# Patient Record
Sex: Female | Born: 1940 | Race: White | Hispanic: No | Marital: Married | Smoking: Never smoker
Health system: Southern US, Community
[De-identification: ages and names within clinical notes are randomized; demographics above are authoritative.]

## PROBLEM LIST (undated history)

## (undated) DIAGNOSIS — K5790 Diverticulosis of intestine, part unspecified, without perforation or abscess without bleeding: Secondary | ICD-10-CM

## (undated) DIAGNOSIS — E785 Hyperlipidemia, unspecified: Secondary | ICD-10-CM

## (undated) DIAGNOSIS — K209 Esophagitis, unspecified without bleeding: Secondary | ICD-10-CM

## (undated) DIAGNOSIS — K219 Gastro-esophageal reflux disease without esophagitis: Secondary | ICD-10-CM

## (undated) DIAGNOSIS — E039 Hypothyroidism, unspecified: Secondary | ICD-10-CM

## (undated) DIAGNOSIS — Z860100 Personal history of colon polyps, unspecified: Secondary | ICD-10-CM

## (undated) DIAGNOSIS — N390 Urinary tract infection, site not specified: Secondary | ICD-10-CM

## (undated) DIAGNOSIS — K5909 Other constipation: Secondary | ICD-10-CM

## (undated) DIAGNOSIS — I10 Essential (primary) hypertension: Secondary | ICD-10-CM

## (undated) DIAGNOSIS — I639 Cerebral infarction, unspecified: Secondary | ICD-10-CM

## (undated) HISTORY — PX: ESOPHAGOGASTRODUODENOSCOPY: SHX1529

## (undated) HISTORY — PX: COLONOSCOPY: SHX174

## (undated) HISTORY — PX: ABDOMINAL HYSTERECTOMY: SHX81

## (undated) HISTORY — PX: CERVICAL LAMINECTOMY: SHX94

## (undated) HISTORY — PX: BACK SURGERY: SHX140

---

## 2004-05-08 ENCOUNTER — Ambulatory Visit: Payer: Self-pay | Admitting: Internal Medicine

## 2004-05-19 ENCOUNTER — Ambulatory Visit: Payer: Self-pay | Admitting: Internal Medicine

## 2004-05-19 ENCOUNTER — Inpatient Hospital Stay: Payer: Self-pay | Admitting: Surgery

## 2005-05-27 ENCOUNTER — Ambulatory Visit: Payer: Self-pay

## 2005-06-07 ENCOUNTER — Ambulatory Visit: Payer: Self-pay

## 2005-10-14 ENCOUNTER — Ambulatory Visit: Payer: Self-pay | Admitting: Gastroenterology

## 2006-05-30 ENCOUNTER — Ambulatory Visit: Payer: Self-pay | Admitting: Internal Medicine

## 2006-09-13 ENCOUNTER — Ambulatory Visit: Payer: Self-pay

## 2007-07-11 ENCOUNTER — Other Ambulatory Visit: Payer: Self-pay

## 2007-07-11 ENCOUNTER — Observation Stay: Payer: Self-pay | Admitting: Internal Medicine

## 2009-02-28 ENCOUNTER — Emergency Department: Payer: Self-pay | Admitting: Emergency Medicine

## 2009-03-17 ENCOUNTER — Ambulatory Visit: Payer: Self-pay | Admitting: Internal Medicine

## 2009-07-31 ENCOUNTER — Ambulatory Visit: Payer: Self-pay | Admitting: Specialist

## 2009-10-31 ENCOUNTER — Ambulatory Visit: Payer: Self-pay | Admitting: Internal Medicine

## 2010-09-10 ENCOUNTER — Ambulatory Visit: Payer: Self-pay | Admitting: Internal Medicine

## 2010-11-18 ENCOUNTER — Ambulatory Visit: Payer: Self-pay | Admitting: Internal Medicine

## 2010-12-09 ENCOUNTER — Ambulatory Visit: Payer: Self-pay | Admitting: Gastroenterology

## 2010-12-14 LAB — PATHOLOGY REPORT

## 2011-11-25 ENCOUNTER — Ambulatory Visit: Payer: Self-pay | Admitting: Internal Medicine

## 2012-11-27 ENCOUNTER — Ambulatory Visit: Payer: Self-pay | Admitting: Internal Medicine

## 2013-08-27 DIAGNOSIS — E785 Hyperlipidemia, unspecified: Secondary | ICD-10-CM | POA: Insufficient documentation

## 2013-08-27 DIAGNOSIS — K219 Gastro-esophageal reflux disease without esophagitis: Secondary | ICD-10-CM | POA: Insufficient documentation

## 2013-08-27 DIAGNOSIS — N951 Menopausal and female climacteric states: Secondary | ICD-10-CM | POA: Insufficient documentation

## 2013-08-27 DIAGNOSIS — E039 Hypothyroidism, unspecified: Secondary | ICD-10-CM | POA: Insufficient documentation

## 2013-10-17 ENCOUNTER — Other Ambulatory Visit: Payer: Self-pay | Admitting: Vascular Surgery

## 2013-10-17 LAB — LIPID PANEL
CHOLESTEROL: 200 mg/dL (ref 0–200)
HDL Cholesterol: 76 mg/dL — ABNORMAL HIGH (ref 40–60)
Ldl Cholesterol, Calc: 99 mg/dL (ref 0–100)
Triglycerides: 123 mg/dL (ref 0–200)
VLDL CHOLESTEROL, CALC: 25 mg/dL (ref 5–40)

## 2014-04-11 ENCOUNTER — Ambulatory Visit: Payer: Self-pay | Admitting: Gastroenterology

## 2014-05-07 ENCOUNTER — Ambulatory Visit: Payer: Self-pay | Admitting: Ophthalmology

## 2014-06-17 LAB — SURGICAL PATHOLOGY

## 2016-04-13 ENCOUNTER — Other Ambulatory Visit: Payer: Self-pay | Admitting: Family Medicine

## 2016-04-13 DIAGNOSIS — Z1231 Encounter for screening mammogram for malignant neoplasm of breast: Secondary | ICD-10-CM

## 2016-05-12 ENCOUNTER — Ambulatory Visit
Admission: RE | Admit: 2016-05-12 | Discharge: 2016-05-12 | Disposition: A | Discharge status: Home / Self Care | Payer: Medicare Other | Source: Ambulatory Visit | Attending: Family Medicine | Admitting: Family Medicine

## 2016-05-12 DIAGNOSIS — Z1231 Encounter for screening mammogram for malignant neoplasm of breast: Secondary | ICD-10-CM | POA: Insufficient documentation

## 2017-02-25 ENCOUNTER — Encounter: Payer: Self-pay | Admitting: *Deleted

## 2017-02-28 ENCOUNTER — Ambulatory Visit
Admission: RE | Admit: 2017-02-28 | Discharge: 2017-02-28 | Disposition: A | Discharge status: Home / Self Care | Payer: Medicare Other | Source: Ambulatory Visit | Attending: Gastroenterology | Admitting: Gastroenterology

## 2017-02-28 ENCOUNTER — Ambulatory Visit: Payer: Medicare Other | Admitting: Anesthesiology

## 2017-02-28 ENCOUNTER — Encounter: Payer: Self-pay | Admitting: Anesthesiology

## 2017-02-28 ENCOUNTER — Encounter
Admission: RE | Disposition: A | Discharge status: Home / Self Care | Payer: Self-pay | Source: Ambulatory Visit | Attending: Gastroenterology

## 2017-02-28 DIAGNOSIS — K219 Gastro-esophageal reflux disease without esophagitis: Secondary | ICD-10-CM | POA: Insufficient documentation

## 2017-02-28 DIAGNOSIS — Z1211 Encounter for screening for malignant neoplasm of colon: Secondary | ICD-10-CM | POA: Diagnosis not present

## 2017-02-28 DIAGNOSIS — D120 Benign neoplasm of cecum: Secondary | ICD-10-CM | POA: Insufficient documentation

## 2017-02-28 DIAGNOSIS — Z86010 Personal history of colonic polyps: Secondary | ICD-10-CM | POA: Insufficient documentation

## 2017-02-28 DIAGNOSIS — I10 Essential (primary) hypertension: Secondary | ICD-10-CM | POA: Insufficient documentation

## 2017-02-28 DIAGNOSIS — Z8719 Personal history of other diseases of the digestive system: Secondary | ICD-10-CM | POA: Insufficient documentation

## 2017-02-28 DIAGNOSIS — K644 Residual hemorrhoidal skin tags: Secondary | ICD-10-CM | POA: Insufficient documentation

## 2017-02-28 DIAGNOSIS — Z8673 Personal history of transient ischemic attack (TIA), and cerebral infarction without residual deficits: Secondary | ICD-10-CM | POA: Diagnosis not present

## 2017-02-28 DIAGNOSIS — Z7902 Long term (current) use of antithrombotics/antiplatelets: Secondary | ICD-10-CM | POA: Diagnosis not present

## 2017-02-28 DIAGNOSIS — D122 Benign neoplasm of ascending colon: Secondary | ICD-10-CM | POA: Insufficient documentation

## 2017-02-28 DIAGNOSIS — E039 Hypothyroidism, unspecified: Secondary | ICD-10-CM | POA: Insufficient documentation

## 2017-02-28 DIAGNOSIS — Z79899 Other long term (current) drug therapy: Secondary | ICD-10-CM | POA: Insufficient documentation

## 2017-02-28 DIAGNOSIS — K641 Second degree hemorrhoids: Secondary | ICD-10-CM | POA: Diagnosis not present

## 2017-02-28 DIAGNOSIS — K6389 Other specified diseases of intestine: Secondary | ICD-10-CM | POA: Insufficient documentation

## 2017-02-28 DIAGNOSIS — E785 Hyperlipidemia, unspecified: Secondary | ICD-10-CM | POA: Diagnosis not present

## 2017-02-28 DIAGNOSIS — K5909 Other constipation: Secondary | ICD-10-CM | POA: Diagnosis not present

## 2017-02-28 DIAGNOSIS — Q438 Other specified congenital malformations of intestine: Secondary | ICD-10-CM | POA: Insufficient documentation

## 2017-02-28 DIAGNOSIS — D123 Benign neoplasm of transverse colon: Secondary | ICD-10-CM | POA: Diagnosis not present

## 2017-02-28 DIAGNOSIS — D124 Benign neoplasm of descending colon: Secondary | ICD-10-CM | POA: Insufficient documentation

## 2017-02-28 HISTORY — DX: Essential (primary) hypertension: I10

## 2017-02-28 HISTORY — PX: COLONOSCOPY WITH PROPOFOL: SHX5780

## 2017-02-28 HISTORY — DX: Esophagitis, unspecified without bleeding: K20.90

## 2017-02-28 HISTORY — DX: Gastro-esophageal reflux disease without esophagitis: K21.9

## 2017-02-28 HISTORY — DX: Diverticulosis of intestine, part unspecified, without perforation or abscess without bleeding: K57.90

## 2017-02-28 HISTORY — DX: Personal history of colon polyps, unspecified: Z86.0100

## 2017-02-28 HISTORY — DX: Urinary tract infection, site not specified: N39.0

## 2017-02-28 HISTORY — DX: Hyperlipidemia, unspecified: E78.5

## 2017-02-28 HISTORY — DX: Cerebral infarction, unspecified: I63.9

## 2017-02-28 HISTORY — DX: Esophagitis, unspecified: K20.9

## 2017-02-28 HISTORY — DX: Hypothyroidism, unspecified: E03.9

## 2017-02-28 HISTORY — DX: Other constipation: K59.09

## 2017-02-28 HISTORY — DX: Personal history of colonic polyps: Z86.010

## 2017-02-28 SURGERY — COLONOSCOPY WITH PROPOFOL
Anesthesia: General

## 2017-02-28 MED ORDER — LIDOCAINE HCL (PF) 2 % IJ SOLN
INTRAMUSCULAR | Status: AC
  Filled 2017-02-28: qty 10

## 2017-02-28 MED ORDER — PROPOFOL 500 MG/50ML IV EMUL
INTRAVENOUS | Status: AC
  Filled 2017-02-28: qty 50

## 2017-02-28 MED ORDER — SODIUM CHLORIDE 0.9 % IV SOLN
INTRAVENOUS | Status: DC
  Administered 2017-02-28: 1000 mL via INTRAVENOUS
  Administered 2017-02-28: 10:00:00 via INTRAVENOUS

## 2017-02-28 MED ORDER — PHENYLEPHRINE HCL 10 MG/ML IJ SOLN
INTRAMUSCULAR | Status: DC | PRN
  Administered 2017-02-28 (×3): 100 ug via INTRAVENOUS

## 2017-02-28 MED ORDER — FENTANYL CITRATE (PF) 100 MCG/2ML IJ SOLN
INTRAMUSCULAR | Status: AC
  Filled 2017-02-28: qty 2

## 2017-02-28 MED ORDER — PROPOFOL 500 MG/50ML IV EMUL
INTRAVENOUS | Status: DC | PRN
  Administered 2017-02-28: 160 ug/kg/min via INTRAVENOUS

## 2017-02-28 MED ORDER — SODIUM CHLORIDE 0.9 % IV SOLN: INTRAVENOUS | Status: DC

## 2017-02-28 MED ORDER — PROPOFOL 10 MG/ML IV BOLUS
INTRAVENOUS | Status: AC
  Filled 2017-02-28: qty 20

## 2017-02-28 MED ORDER — PROPOFOL 10 MG/ML IV BOLUS
INTRAVENOUS | Status: DC | PRN
  Administered 2017-02-28: 30 mg via INTRAVENOUS

## 2017-02-28 MED ORDER — MIDAZOLAM HCL 2 MG/2ML IJ SOLN
INTRAMUSCULAR | Status: DC | PRN
  Administered 2017-02-28: 1 mg via INTRAVENOUS

## 2017-02-28 MED ORDER — MIDAZOLAM HCL 2 MG/2ML IJ SOLN
INTRAMUSCULAR | Status: AC
  Filled 2017-02-28: qty 2

## 2017-02-28 MED ORDER — FENTANYL CITRATE (PF) 100 MCG/2ML IJ SOLN
INTRAMUSCULAR | Status: DC | PRN
  Administered 2017-02-28: 50 ug via INTRAVENOUS

## 2017-02-28 NOTE — Anesthesia Procedure Notes (Signed)
Date/Time: 02/28/2017 9:50 AM Performed by: Allean Found, CRNA Pre-anesthesia Checklist: Patient identified, Emergency Drugs available, Suction available, Patient being monitored and Timeout performed Patient Re-evaluated:Patient Re-evaluated prior to induction Oxygen Delivery Method: Nasal cannula Placement Confirmation: positive ETCO2

## 2017-02-28 NOTE — H&P (Signed)
Outpatient short stay form Pre-procedure 02/28/2017 9:39 AM Lollie Sails MD  Primary Physician: Dr Derinda Late   Reason for visit:  Colonoscopy  History of present illness:  Patient is a 77 year old female presenting today as above. She has personal history of adenomatous colon polyps. She does take Plavix and states her last time she took it was on Wednesday morning. She takes no other blood thinning medications. She tolerated her prep well.    Current Facility-Administered Medications:  .  0.9 %  sodium chloride infusion, , Intravenous, Continuous, Lollie Sails, MD, Last Rate: 20 mL/hr at 02/28/17 0859, 1,000 mL at 02/28/17 0859 .  0.9 %  sodium chloride infusion, , Intravenous, Continuous, Lollie Sails, MD  Medications Prior to Admission  Medication Sig Dispense Refill Last Dose  . b complex vitamins tablet Take 1 tablet by mouth daily.   Past Week at Unknown time  . clopidogrel (PLAVIX) 75 MG tablet Take 75 mg by mouth daily.   Past Week at Unknown time  . co-enzyme Q-10 30 MG capsule Take 10 mg by mouth daily.   Past Week at Unknown time  . estrogens, conjugated, (PREMARIN) 0.45 MG tablet Take 0.45 mg by mouth daily. Take daily for 21 days then do not take for 7 days.   Past Week at Unknown time  . levothyroxine (SYNTHROID, LEVOTHROID) 75 MCG tablet Take 75 mcg by mouth daily before breakfast.   02/27/2017 at Unknown time  . omega-3 acid ethyl esters (LOVAZA) 1 g capsule Take 1 g by mouth.   Past Week at Unknown time  . omeprazole (PRILOSEC) 20 MG capsule Take 20 mg by mouth daily.   Past Week at Unknown time  . Red Yeast Rice 600 MG CAPS Take 1,200 mg by mouth Nightly.   Past Week at Unknown time  . Turmeric 500 MG TABS Take 150 mg by mouth daily.   Past Week at Unknown time  . vitamin E 400 UNIT capsule Take 400 Units by mouth daily.   Past Week at Unknown time     Not on File   Past Medical History:  Diagnosis Date  . Chronic constipation   .  Diverticulosis   . Esophagitis   . GERD (gastroesophageal reflux disease)   . History of colon polyps   . Hyperlipidemia   . Hypertension   . Hypothyroidism   . Stroke ([place])     TIA-2011  . UTI (urinary tract infection)     Review of systems:      Physical Exam    Heart and lungs: Regular rate and rhythm without rub or gallop, lungs are bilaterally clear.    HEENT: Normocephalic atraumatic eyes are anicteric    Other:     Pertinant exam for procedure: Soft nontender nondistended bowel sounds positive normoactive    Planned proceedures: Colonoscopy and indicated procedures. I have discussed the risks benefits and complications of procedures to include not limited to bleeding, infection, perforation and the risk of sedation and the patient wishes to proceed.    Lollie Sails, MD Gastroenterology 02/28/2017  9:39 AM

## 2017-02-28 NOTE — Transfer of Care (Signed)
Immediate Anesthesia Transfer of Care Note  Patient: Cassandra Johns  Procedure(s) Performed: COLONOSCOPY WITH PROPOFOL (N/A )  Patient Location: PACU  Anesthesia Type:General  Level of Consciousness: awake  Airway & Oxygen Therapy: Patient Spontanous Breathing and Patient connected to nasal cannula oxygen  Post-op Assessment: Report given to RN and Post -op Vital signs reviewed and stable  Post vital signs: Reviewed and stable  Last Vitals:  Vitals:   02/28/17 0835 02/28/17 1055  BP: (!) 127/54 (!) 90/59  Pulse: 68 76  Resp: 16 16  Temp: 36.4 C (!) 35.8 C  SpO2: 100% 99%    Last Pain:  Vitals:   02/28/17 1055  TempSrc: Tympanic         Complications: No apparent anesthesia complications

## 2017-02-28 NOTE — Anesthesia Preprocedure Evaluation (Signed)
Anesthesia Evaluation  Patient identified by MRN, date of birth, ID band Patient awake    Reviewed: Allergy & Precautions, H&P , NPO status , Patient's Chart, lab work & pertinent test results, reviewed documented beta blocker date and time   History of Anesthesia Complications Negative for: history of anesthetic complications  Airway Mallampati: I  TM Distance: >3 FB Neck ROM: full    Dental  (+) Caps, Dental Advidsory Given   Pulmonary neg pulmonary ROS,           Cardiovascular Exercise Tolerance: Good hypertension, (-) angina(-) CAD, (-) Past MI, (-) Cardiac Stents and (-) CABG (-) dysrhythmias (-) Valvular Problems/Murmurs     Neuro/Psych neg Seizures TIAnegative psych ROS   GI/Hepatic Neg liver ROS, GERD  ,  Endo/Other  neg diabetesHypothyroidism   Renal/GU negative Renal ROS  negative genitourinary   Musculoskeletal   Abdominal   Peds  Hematology negative hematology ROS (+)   Anesthesia Other Findings Past Medical History: No date: Chronic constipation No date: Diverticulosis No date: Esophagitis No date: GERD (gastroesophageal reflux disease) No date: History of colon polyps No date: Hyperlipidemia No date: Hypertension No date: Hypothyroidism No date: Stroke [facility])     Comment:   TIA-2011 No date: UTI (urinary tract infection)   Reproductive/Obstetrics negative OB ROS                             Anesthesia Physical Anesthesia Plan  ASA: II  Anesthesia Plan: General   Post-op Pain Management:    Induction: Intravenous  PONV Risk Score and Plan: 3 and Propofol infusion  Airway Management Planned: Nasal Cannula  Additional Equipment:   Intra-op Plan:   Post-operative Plan:   Informed Consent: I have reviewed the patients History and Physical, chart, labs and discussed the procedure including the risks, benefits and alternatives for the proposed anesthesia with  the patient or authorized representative who has indicated his/her understanding and acceptance.   Dental Advisory Given  Plan Discussed with: Anesthesiologist, CRNA and Surgeon  Anesthesia Plan Comments:         Anesthesia Quick Evaluation

## 2017-02-28 NOTE — Anesthesia Postprocedure Evaluation (Signed)
Anesthesia Post Note  Patient: Cassandra Johns  Procedure(s) Performed: COLONOSCOPY WITH PROPOFOL (N/A )  Patient location during evaluation: Endoscopy Anesthesia Type: General Level of consciousness: awake and alert Pain management: pain level controlled Vital Signs Assessment: post-procedure vital signs reviewed and stable Respiratory status: spontaneous breathing, nonlabored ventilation, respiratory function stable and patient connected to nasal cannula oxygen Cardiovascular status: blood pressure returned to baseline and stable Postop Assessment: no apparent nausea or vomiting Anesthetic complications: no     Last Vitals:  Vitals:   02/28/17 1125 02/28/17 1135  BP: 119/74 110/76  Pulse: 77 71  Resp: 17 17  Temp:    SpO2: 100% 100%    Last Pain:  Vitals:   02/28/17 1055  TempSrc: Tympanic                 Martha Clan

## 2017-02-28 NOTE — Anesthesia Post-op Follow-up Note (Signed)
Anesthesia QCDR form completed.        

## 2017-02-28 NOTE — Op Note (Signed)
[facility] Gastroenterology Patient Name: Cassandra Johns Procedure Date: 02/28/2017 9:29 AM MRN: [id number] Account #: [id number] Date of Birth: Dec 04, 1940 Admit Type: Outpatient Age: 77 Room: [facility] ENDO ROOM 1 Gender: Female Note Status: Finalized Procedure:            Colonoscopy Indications:          Personal history of colonic polyps Providers:            Lollie Sails, MD Referring MD:         Caprice Renshaw MD (Referring MD) Medicines:            Monitored Anesthesia Care Complications:        No immediate complications. Procedure:            Pre-Anesthesia Assessment:                       - ASA Grade Assessment: III - A patient with severe                        systemic disease.                       After obtaining informed consent, the colonoscope was                        passed under direct vision. Throughout the procedure,                        the patient's blood pressure, pulse, and oxygen                        saturations were monitored continuously. The                        Colonoscope was introduced through the anus and                        advanced to the the cecum, identified by appendiceal                        orifice and ileocecal valve. The colonoscopy was                        performed with moderate difficulty due to significant                        looping and a tortuous colon. Successful completion of                        the procedure was aided by changing the patient to a                        supine position and changing the patient to a prone                        position. The patient tolerated the procedure well. The                        quality of the bowel preparation was fair. Findings:      A 3 mm polyp  was found in the transverse colon. The polyp was sessile.       The polyp was removed with a cold biopsy forceps. Resection and       retrieval were complete.      A 5 mm polyp was found in the ascending  colon. The polyp was sessile.      Two sessile polyps were found in the cecum. The polyps were 4 to 6 mm in       size. These polyps were removed with a cold snare. Resection and       retrieval were complete.      A 5 mm polyp was found in the hepatic flexure. The polyp was sessile.       The polyp was removed with a cold snare. Resection and retrieval were       complete.      A 5 mm polyp was found in the mid transverse colon. The polyp was       sessile. The polyp was removed with a cold snare. Resection and       retrieval were complete.      Two sessile polyps were found in the descending colon. The polyps were 2       to 5 mm in size. These polyps were removed with a cold snare. Resection       and retrieval were complete.      A diffuse area of mild melanosis was found in the entire colon.      Multiple medium-mouthed diverticula were found in the sigmoid colon and       descending colon.      The digital rectal exam was normal.      Non-bleeding external and internal hemorrhoids were found during       retroflexion and during anoscopy. The hemorrhoids were small and Grade       II (internal hemorrhoids that prolapse but reduce spontaneously).      No additional abnormalities were found on retroflexion. Impression:           - Preparation of the colon was fair.                       - One 3 mm polyp in the transverse colon, removed with                        a cold biopsy forceps. Resected and retrieved.                       - One 5 mm polyp in the ascending colon.                       - Two 4 to 6 mm polyps in the cecum, removed with a                        cold snare. Resected and retrieved.                       - One 5 mm polyp at the hepatic flexure, removed with a                        cold snare. Resected and retrieved.                       -  One 5 mm polyp in the mid transverse colon, removed                        with a cold snare. Resected and retrieved.                        - Two 2 to 5 mm polyps in the descending colon, removed                        with a cold snare. Resected and retrieved.                       - Melanosis in the colon.                       - Diverticulosis in the sigmoid colon and in the                        descending colon.                       - Non-bleeding external and internal hemorrhoids. Recommendation:       - Discharge patient to home.                       - Clear liquid diet today.                       - Full liquid diet for 1 day, then advance as tolerated                        to low residue diet for 2 days. Procedure Code(s):    --- Professional ---                       [id number], Colonoscopy, flexible; with removal of tumor(s),                        polyp(s), or other lesion(s) by snare technique                       45380, 62, Colonoscopy, flexible; with biopsy, single                        or multiple Diagnosis Code(s):    --- Professional ---                       K64.1, Second degree hemorrhoids                       D12.2, Benign neoplasm of ascending colon                       D12.3, Benign neoplasm of transverse colon (hepatic                        flexure or splenic flexure)                       D12.0, Benign neoplasm of cecum  D12.4, Benign neoplasm of descending colon                       K63.89, Other specified diseases of intestine                       Z86.010, Personal history of colonic polyps                       K57.30, Diverticulosis of large intestine without                        perforation or abscess without bleeding CPT copyright 2016 American Medical Association. All rights reserved. The codes documented in this report are preliminary and upon coder review may  be revised to meet current compliance requirements. Lollie Sails, MD 02/28/2017 10:53:59 AM This report has been signed electronically. Number of Addenda: 0 Note Initiated On: 02/28/2017  9:29 AM Scope Withdrawal Time: 0 hours 32 minutes 53 seconds  Total Procedure Duration: 0 hours 55 minutes 30 seconds       [facility]

## 2017-03-01 ENCOUNTER — Encounter: Payer: Self-pay | Admitting: Gastroenterology

## 2017-03-01 LAB — SURGICAL PATHOLOGY

## 2017-05-10 ENCOUNTER — Encounter: Payer: Self-pay | Admitting: Emergency Medicine

## 2017-05-10 ENCOUNTER — Emergency Department
Admission: EM | Admit: 2017-05-10 | Discharge: 2017-05-10 | Disposition: A | Discharge status: Home / Self Care | Payer: Medicare Other | Attending: Emergency Medicine | Admitting: Emergency Medicine

## 2017-05-10 ENCOUNTER — Other Ambulatory Visit: Payer: Self-pay

## 2017-05-10 ENCOUNTER — Emergency Department: Payer: Medicare Other

## 2017-05-10 DIAGNOSIS — Y929 Unspecified place or not applicable: Secondary | ICD-10-CM | POA: Diagnosis not present

## 2017-05-10 DIAGNOSIS — S01511A Laceration without foreign body of lip, initial encounter: Secondary | ICD-10-CM | POA: Diagnosis not present

## 2017-05-10 DIAGNOSIS — Z23 Encounter for immunization: Secondary | ICD-10-CM | POA: Insufficient documentation

## 2017-05-10 DIAGNOSIS — S60511A Abrasion of right hand, initial encounter: Secondary | ICD-10-CM | POA: Insufficient documentation

## 2017-05-10 DIAGNOSIS — Y939 Activity, unspecified: Secondary | ICD-10-CM | POA: Insufficient documentation

## 2017-05-10 DIAGNOSIS — S60512A Abrasion of left hand, initial encounter: Secondary | ICD-10-CM | POA: Insufficient documentation

## 2017-05-10 DIAGNOSIS — W19XXXA Unspecified fall, initial encounter: Secondary | ICD-10-CM | POA: Insufficient documentation

## 2017-05-10 DIAGNOSIS — Y999 Unspecified external cause status: Secondary | ICD-10-CM | POA: Insufficient documentation

## 2017-05-10 DIAGNOSIS — Z7902 Long term (current) use of antithrombotics/antiplatelets: Secondary | ICD-10-CM | POA: Diagnosis not present

## 2017-05-10 DIAGNOSIS — S0990XA Unspecified injury of head, initial encounter: Secondary | ICD-10-CM | POA: Diagnosis present

## 2017-05-10 DIAGNOSIS — E039 Hypothyroidism, unspecified: Secondary | ICD-10-CM | POA: Diagnosis not present

## 2017-05-10 DIAGNOSIS — Z79899 Other long term (current) drug therapy: Secondary | ICD-10-CM | POA: Insufficient documentation

## 2017-05-10 DIAGNOSIS — I10 Essential (primary) hypertension: Secondary | ICD-10-CM | POA: Insufficient documentation

## 2017-05-10 MED ORDER — AMOXICILLIN 500 MG PO CAPS: 500.0000 mg | ORAL_CAPSULE | Freq: Three times a day (TID) | ORAL | 0 refills | Status: DC

## 2017-05-10 MED ORDER — LIDOCAINE-EPINEPHRINE-TETRACAINE (LET) SOLUTION
3.0000 mL | Freq: Once | NASAL | Status: AC
  Administered 2017-05-10: 18:00:00 3 mL via TOPICAL
  Filled 2017-05-10: qty 3

## 2017-05-10 MED ORDER — TETANUS-DIPHTH-ACELL PERTUSSIS 5-2.5-18.5 LF-MCG/0.5 IM SUSP
0.5000 mL | Freq: Once | INTRAMUSCULAR | Status: AC
  Administered 2017-05-10: 0.5 mL via INTRAMUSCULAR
  Filled 2017-05-10: qty 0.5

## 2017-05-10 NOTE — ED Notes (Signed)
Patient transported to CT 

## 2017-05-10 NOTE — Discharge Instructions (Signed)
Follow-up with your regular doctor if you are not better in 3-5 days.  Use medication as prescribed.  If you are worsening please return the emergency department.  You still may want to see your dentist to make sure there is not a crack in your tooth.

## 2017-05-10 NOTE — ED Provider Notes (Signed)
[facility] Emergency Department Provider Note  ____________________________________________   First MD Initiated Contact with Patient 05/10/17 1638     (approximate)  I have reviewed the triage vital signs and the nursing notes.   HISTORY  Chief Complaint Fall    HPI Cassandra Johns is a 77 y.o. female fell earlier today and landed on her hands and face.  States she bit through her lip.  She denies any loss consciousness or headache.  She denies any back pain or neck pain.  She states that she is also on Plavix.  She is unsure of her last tetanus  Past Medical History:  Diagnosis Date  . Chronic constipation   . Diverticulosis   . Esophagitis   . GERD (gastroesophageal reflux disease)   . History of colon polyps   . Hyperlipidemia   . Hypertension   . Hypothyroidism   . Stroke ([place])     TIA-2011  . UTI (urinary tract infection)     There are no active problems to display for this patient.   Past Surgical History:  Procedure Laterality Date  . ABDOMINAL HYSTERECTOMY     WITH BSO  . BACK SURGERY     CERVICAL LAMINECTOMY  . CERVICAL LAMINECTOMY     X 2  . COLONOSCOPY    . COLONOSCOPY WITH PROPOFOL N/A 02/28/2017   Procedure: COLONOSCOPY WITH PROPOFOL;  Surgeon: Lollie Sails, MD;  Location: [facility] ENDOSCOPY;  Service: Endoscopy;  Laterality: N/A;  . ESOPHAGOGASTRODUODENOSCOPY      Prior to Admission medications   Medication Sig Start Date End Date Taking? Authorizing Provider  amoxicillin (AMOXIL) 500 MG capsule Take 1 capsule (500 mg total) by mouth 3 (three) times daily. 05/10/17   Versie Starks, PA-C  b complex vitamins tablet Take 1 tablet by mouth daily.    [provider]  clopidogrel (PLAVIX) 75 MG tablet Take 75 mg by mouth daily.    [provider]  co-enzyme Q-10 30 MG capsule Take 10 mg by mouth daily.    [provider]  estrogens, conjugated, (PREMARIN) 0.45 MG tablet Take 0.45 mg by mouth daily.  Take daily for 21 days then do not take for 7 days.    [provider]  levothyroxine (SYNTHROID, LEVOTHROID) 75 MCG tablet Take 75 mcg by mouth daily before breakfast.    [provider]  omega-3 acid ethyl esters (LOVAZA) 1 g capsule Take 1 g by mouth.    [provider]  omeprazole (PRILOSEC) 20 MG capsule Take 20 mg by mouth daily.    [provider]  Red Yeast Rice 600 MG CAPS Take 1,200 mg by mouth Nightly.    [provider]  Turmeric 500 MG TABS Take 150 mg by mouth daily.    [provider]  vitamin E 400 UNIT capsule Take 400 Units by mouth daily.    [provider]    Allergies Patient has no known allergies.  Family History  Problem Relation Age of Onset  . Breast cancer Maternal Aunt        40's  . Breast cancer Maternal Grandmother     Social History Social History   Tobacco Use  . Smoking status: Never Smoker  . Smokeless tobacco: Never Used  Substance Use Topics  . Alcohol use: No    Frequency: Never  . Drug use: No    Review of Systems  Constitutional: No fever/chills, denies headache Eyes: No visual changes. ENT:  No sore throat.  Positive for lip laceration Respiratory: Denies cough Genitourinary: Negative for dysuria. Musculoskeletal: Negative for back pain.  Positive for bilateral hand abrasions Skin: Negative for rash.  Abrasions to the hand, laceration to the lip    ____________________________________________   PHYSICAL EXAM:  VITAL SIGNS: ED Triage Vitals [05/10/17 1555]  Enc Vitals Group     BP (!) 150/80     Pulse Rate 98     Resp 20     Temp 98 F (36.7 C)     Temp Source Oral     SpO2 97 %     Weight 125 lb (56.7 kg)     Height 5\' 3"  (1.6 m)     Head Circumference      Peak Flow      Pain Score      Pain Loc      Pain Edu?      Excl. in [place]?     Constitutional: Alert and oriented. Well appearing and in no acute distress. Eyes: Conjunctivae are normal.  Head:  Positive for laceration to the lip Nose: No congestion/rhinnorhea. Mouth/Throat: Mucous membranes are moist.  Positive for laceration to the inner upper lip Cardiovascular: Normal rate, regular rhythm.  Heart sounds are normal Respiratory: Normal respiratory effort.  No retractions, lungs clear to auscultation GU: deferred Musculoskeletal: FROM all extremities, warm and well perfused, hands and wrist are nontender, there is an abrasion to the palm of both hands Neurologic:  Normal speech and language.  Skin:  Skin is warm, dry and intact. No rash noted. Psychiatric: Mood and affect are normal. Speech and behavior are normal.  ____________________________________________   LABS (all labs ordered are listed, but only abnormal results are displayed)  Labs Reviewed - No data to display ____________________________________________   ____________________________________________  RADIOLOGY  CT of the head is negative for any acute abnormality  ____________________________________________   PROCEDURES  Procedure(s) performed:   Marland KitchenMarland KitchenLaceration Repair Date/Time: 05/10/2017 6:04 PM Performed by: Versie Starks, PA-C Authorized by: Versie Starks, PA-C   Consent:    Consent obtained:  Verbal   Consent given by:  Patient   Risks discussed:  Infection, poor cosmetic result, poor wound healing and pain   Alternatives discussed:  No treatment Anesthesia (see MAR for exact dosages):    Anesthesia method:  Topical application   Topical anesthetic:  LET Laceration details:    Location:  Lip   Lip location:  Upper interior lip   Length (cm):  1   Depth (mm):  2 Repair type:    Repair type:  Simple Pre-procedure details:    Preparation:  Patient was prepped and draped in usual sterile fashion Exploration:    Hemostasis achieved with:  LET   Wound exploration: wound explored through full range of motion     Wound extent: no foreign bodies/material noted and no underlying fracture  noted     Contaminated: no   Treatment:    Area cleansed with:  Saline   Amount of cleaning:  Standard   Irrigation method:  Tap   Visualized foreign bodies/material removed: no   Skin repair:    Repair method:  Sutures   Suture size:  5-0   Suture material:  Fast-absorbing gut   Number of sutures:  1 Approximation:    Approximation:  Close Post-procedure details:    Dressing:  Open (no dressing)   Patient tolerance of procedure:  Tolerated well, no immediate complications  ____________________________________________   INITIAL IMPRESSION / ASSESSMENT AND PLAN / ED COURSE  Pertinent labs & imaging results that were available during my care of the patient were reviewed by me and considered in my medical decision making (see chart for details).  Patient 77 year old female presents emergency department complaining of a fall earlier today.  She has a lip laceration and abrasions to the hand.  She denies headache.  However she is on Plavix  On physical exam she appears well.  Cranial nerves II through XII are grossly intact.  There is a 1 cm laceration to the left upper inner lip.  CT of the head was ordered due to the Plavix  CT of the head is negative for any acute abnormality  Laceration was repaired using 5-0 Vicryl.  One suture was inserted.  CT results were given to the patient.  Told her that the previous infarct from where she had her TIA was stable.  There were no other abnormalities.  Explained her that the suture would dissolve on its own within a week to 2 weeks.  She is able to eat and do what she needs.  She was given a Tdap while in the ER.  She was discharged in stable condition     As part of my medical decision making, I reviewed the following data within the [place] notes reviewed and incorporated, Radiograph reviewed CT the head is negative for acute abnormality, Notes from prior ED visits and [place] Controlled Substance  Database  ____________________________________________   FINAL CLINICAL IMPRESSION(S) / ED DIAGNOSES  Final diagnoses:  Fall, initial encounter  Lip laceration, initial encounter      NEW MEDICATIONS STARTED DURING THIS VISIT:  New Prescriptions   AMOXICILLIN (AMOXIL) 500 MG CAPSULE    Take 1 capsule (500 mg total) by mouth 3 (three) times daily.     Note:  This document was prepared using Dragon voice recognition software and may include unintentional dictation errors.    Versie Starks, PA-C 05/10/17 Cassandra Aas, MD 05/10/17 [phone]

## 2017-05-10 NOTE — ED Triage Notes (Signed)
Brought over from [facility]  S/p fall  States she stepped wrong   Small laceration noted to lip

## 2017-07-07 ENCOUNTER — Ambulatory Visit: Payer: Medicare Other | Admitting: Podiatry

## 2017-10-03 DIAGNOSIS — I679 Cerebrovascular disease, unspecified: Secondary | ICD-10-CM | POA: Insufficient documentation

## 2018-09-20 ENCOUNTER — Other Ambulatory Visit: Payer: Self-pay | Admitting: Family Medicine

## 2018-09-20 DIAGNOSIS — Z1231 Encounter for screening mammogram for malignant neoplasm of breast: Secondary | ICD-10-CM

## 2018-10-03 ENCOUNTER — Ambulatory Visit
Admission: RE | Admit: 2018-10-03 | Discharge: 2018-10-03 | Disposition: A | Discharge status: Home / Self Care | Payer: Medicare Other | Source: Ambulatory Visit | Attending: Family Medicine | Admitting: Family Medicine

## 2018-10-03 ENCOUNTER — Other Ambulatory Visit: Payer: Self-pay

## 2018-10-03 DIAGNOSIS — Z1231 Encounter for screening mammogram for malignant neoplasm of breast: Secondary | ICD-10-CM | POA: Insufficient documentation

## 2019-05-15 ENCOUNTER — Other Ambulatory Visit: Payer: Self-pay | Admitting: Family Medicine

## 2019-05-15 DIAGNOSIS — R1031 Right lower quadrant pain: Secondary | ICD-10-CM

## 2019-05-23 ENCOUNTER — Ambulatory Visit: Payer: Medicare Other

## 2019-10-09 ENCOUNTER — Encounter: Payer: Self-pay | Admitting: Obstetrics and Gynecology

## 2019-11-01 ENCOUNTER — Encounter: Payer: Self-pay | Admitting: Obstetrics and Gynecology

## 2019-11-26 ENCOUNTER — Other Ambulatory Visit: Payer: Self-pay | Admitting: Obstetrics & Gynecology

## 2019-12-04 NOTE — H&P (Addendum)
Chief Complaint:   Patient ID: Cassandra Johns is a 79 y.o. female presenting with Pre-op Exam  on 12/04/2019  HPI: [name] is a patient referred by Dr. Annie Sable (urology) for vaginal vault prolapse following hysterectomy. She declined management of this with pessary or pelvic floor physical therapy,  and requested surgical intervention.   She complained about a chronic mass between her legs when she sits or after a long day. On exam she was found to have a Grade 3 cystocele, Grade 2 apical descent, and Grade 1 rectocele.   She denies urinary or stool incontinence, urinary retention, pain or dysuria. Hysterectomy in 1994 for benign reasons.  Returns today for preoperative exam. No concerns.   Has been medically cleared by her PCP, Dr. Baldemar Lenis.  "Cerebrovascular disease: She is considering undergoing surgery to help with bladder prolapse. I discussed that she would need to stop plavix for 1 week prior to surgery. This will be a risk of stroke while off the medicine. 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  Past Surgical History:  has a past surgical history that includes Hysterectomy; Colonoscopy (12/09/2010); Colonoscopy (10/14/2005); Colonoscopy (11/19/2003`); egd (04/11/2014); Colonoscopy (02/28/2017); Back surgery; Eye surgery (Right); lens eye surgery (Right, 08/08/2018); and extraction cataract extracapsular w/insertion intraocular prosthesis (Right, 08/08/2018). Family History: family history includes Brain cancer in  her sister; Myocardial Infarction (Heart attack) in her brother and father; Osteoporosis (Thinning of bones) in her mother; Scleroderma in her mother. Social History:  reports that she has never smoked. She has never used smokeless tobacco. She reports current alcohol use. She reports that she does not use drugs. OB/GYN History:  OB History    Gravida  2   Para  1   Term  1   Preterm      AB  1   Living  1     SAB  1   TAB      Ectopic      Molar      Multiple      Live Births  1          Allergies: has No Known Allergies. Medications:  Current Outpatient Medications:  .  ascorbic Acid (VITAMIN C) 500 mg CpER SR capsule, Take 500 mg by mouth every morning   , Disp: , Rfl:  .  aspirin 81 MG EC tablet, Take 1 tablet (81 mg total) by mouth once daily, Disp: 30 tablet, Rfl: 11 .  cholecalciferol (CHOLECALCIFEROL) 1000 unit tablet, Take 5,000 Units by mouth every evening   , Disp: , Rfl:  .  clopidogreL (PLAVIX) 75 mg tablet, TAKE 1 TABLET BY MOUTH ONCE DAILY, Disp: 90 tablet, Rfl: 1 .  coenzyme Q10 10 mg capsule, Take 10 mg by mouth every evening   , Disp: , Rfl:  .  fluticasone (FLONASE) 50 mcg/actuation nasal spray, PLACE 2 SPRAYS INTO BOTH NOSTRILS ONCE DAILY., Disp: 16 g, Rfl: 5 .  levothyroxine (SYNTHROID) 75 MCG tablet, TAKE 1 TABLET BY MOUTH ONCE DAILY ON AN EMPTY STOMACH. WAIT 30 MINUTES BEFORE TAKING OTHER MEDS., Disp:  90 tablet, Rfl: 1 .  omega-3 fatty acids/fish oil 340-1,000 mg capsule, Take 1 capsule by mouth every evening   , Disp: , Rfl:  .  omeprazole (PRILOSEC) 20 MG DR capsule, TAKE 1 CAPSULE BY MOUTH ONCE EVERY MORNING, Disp: 90 capsule, Rfl: 0 .  PREMARIN 0.45 mg tablet, TAKE 1 TABLET BY MOUTH ONCE EVERY MORNING (Patient taking differently: Pt takes 1/2 tablet daily  ), Disp: 90 tablet, Rfl: 1 .  red yeast rice 600 mg Cap, Take 1,200 mg by mouth nightly., Disp: , Rfl:  .  turmeric-herbal complex no.278 150 mg Cap, Take 2 tablets by mouth every evening    , Disp: , Rfl:  .  VIT B COMPLEX 100 NO.2/HERBS (VITAMIN B COMPLEX 100  2-HERBS ORAL), Take 1 tablet by mouth every evening   , Disp: , Rfl:    Review of Systems: No SOB, no palpitations or chest pain, no new lower extremity edema, no nausea or vomiting or bowel or bladder complaints. See HPI for gyn specific ROS.   Exam:   Constitutional: BP 140/83   Ht 160 cm (5\' 3" )   Wt 57.3 kg (126 lb 6.4 oz)   BMI 22.39 kg/m   WDWN female in NAD   HEENT: sclera clear, non-icteric, moist mucous membranes, dentition intact Endocrine:  no thyromegaly Respiratory: normal respiratory effort, CTABL    CV: no peripheral edema, RRR no MRG GI: soft , no mass, non-tender, no rebound tenderness GU: deferred - done at previous visit Skin: warm and well perfused, no rashes Neuro: alert, oriented x3,   Psych: appropriate mood and insight, judgement intact  GU: tanner stage 5 ,              External genitalia/skin: vulva /labia no lesions             Lymphatic: no enlarged inguinal nodes bilaterally             Urethra: no prolapse, no diverticulum, no caruncle Bladder: no tenderness to palpation, cystocele at the introitus with slight valsalva:   grade I rectocele, Grade III cystocele               Vagina: normal physiologic d/c, no lesions  Anterior compartment:  Grade III descent  Posterior compartment: Grade I descent Apical compartment: GradeII descent             Cervix: absent              Uterus: absent     Impression:   The primary encounter diagnosis was Encounter for pre-operative examination. A diagnosis of cystocele, vaginal vault prolapse was also pertinent to this visit.   Plan:   Preop review from [facility]:  Appointment Friday to discuss medication alterations prior to surgery. Stop Plavix Friday.  Planned procedure: Anterior colporrhaphy    The patient and I discussed the technical aspects of the procedure including the potential for risks and complications.These include  but are not limited to the risk of infection requiring post-operative antibiotics or further procedures.We talked about the risk of injury to adjacent organs including bladder, bowel, ureter, blood vessels or nerves, the need to convert to an open incision, possibleneed for blood transfusion andpostop complications such asthromboembolic or cardiopulmonary complications, and postoperative complications including bladder retention and urinary dysfunction.All of her questions were answered. Her preoperative exam was completed She will sign consents on the day of surgery. She is scheduled to undergo this procedure on 12/14/19.   ----- Larey Days, MD,  [place] Attending Obstetrician and Gynecologist [facility], Department of [facility]

## 2019-12-06 ENCOUNTER — Other Ambulatory Visit: Payer: Self-pay

## 2019-12-06 ENCOUNTER — Encounter
Admission: RE | Admit: 2019-12-06 | Discharge: 2019-12-06 | Disposition: A | Discharge status: Home / Self Care | Payer: Medicare Other | Source: Ambulatory Visit | Attending: Obstetrics & Gynecology | Admitting: Obstetrics & Gynecology

## 2019-12-06 NOTE — Pre-Procedure Instructions (Signed)
Progress Notes - documented in this encounter Fath, [name], MD - 07/24/2019 11:30 AM EDT Formatting of this note is different from the original.   Chief Complaint: Chief Complaint  Patient presents with  . Follow-up  ETT AND ECHO  Date of Service: 07/24/2019 Date of Birth: 07/01/1940 PCP: Barnabas Lister, MD  History of Present Illness: Cassandra Johns is a 79 y.o.female patient who presents for follow-up visit. She was seen recently for evaluation due to a calcium score of 245 with a family history of heart disease. Electrocardiogram at that time revealed sinus rhythm at a rate of 72 with a PR interval of 142 ms, QRS duration of 72 ms with a QTC of 429 ms QRS axis of 3 degrees. There is no ischemia. She has some dyspnea but basically is fairly asymptomatic. She has a very strong family history of heart disease with myocardial infarction's at age she is younger than her. She is on red yeast rice. She denies syncope or presyncope. He underwent an ETT with excellent exercise tolerance with no arrhythmia, symptoms or ischemia. Echocardiogram showed preserved LV function with no significant valvular abnormalities. Her HDL is over 100. LDL is 112. She is active without any symptoms.  Past Medical and Surgical History  Past Medical History Past Medical History:  Diagnosis Date  . Adenomatous polyps  Colonoscopy 11/19/2003  . 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  . Chronic constipation  . Diverticulosis  On colonoscopies 10/14/05 and 12/09/2010  . Esophagitis 04/11/2014  . GERD (gastroesophageal reflux disease)  . Hyperlipidemia  . Hypertension  . Hypothyroidism  . Stroke (CMS-HCC)  TIA 2011 LEFT ARM NUMBNESS -NO RESIDUALS  . TIA (transient ischemic attack) 2011  . UTI (urinary tract infection)   Past Surgical History She has a past surgical history that includes Hysterectomy; Colonoscopy (12/09/2010); Colonoscopy (10/14/2005); Colonoscopy (11/19/2003`); egd (04/11/2014);  Colonoscopy (02/28/2017); Back surgery; Eye surgery (Right); lens eye surgery (Right, 08/08/2018); and extraction cataract extracapsular w/insertion intraocular prosthesis (Right, 08/08/2018).   Medications and Allergies  Current Medications  Current Outpatient Medications  Medication Sig Dispense Refill  . ascorbic Acid (VITAMIN C) 500 mg CpER SR capsule Take 500 mg by mouth every morning  . aspirin 81 MG EC tablet Take 1 tablet (81 mg total) by mouth once daily 30 tablet 11  . cholecalciferol (CHOLECALCIFEROL) 1000 unit tablet Take 5,000 Units by mouth every evening  . citalopram (CELEXA) 10 MG tablet Take 1 tablet (10 mg total) by mouth once daily 90 tablet 1  . clopidogreL (PLAVIX) 75 mg tablet TAKE 1 TABLET BY MOUTH ONCE DAILY 90 tablet 1  . coenzyme Q10 10 mg capsule Take 10 mg by mouth every evening  . estrogens, conjugated, (PREMARIN) 0.45 MG tablet Take 1 tablet (0.45 mg total) by mouth every morning (Patient taking differently: Take 0.225 mg by mouth every morning ) 90 tablet 1  . fluticasone (FLONASE) 50 mcg/actuation nasal spray PLACE 2 SPRAYS INTO BOTH NOSTRILS ONCE DAILY. 16 g 5  . levothyroxine (SYNTHROID) 75 MCG tablet TAKE 1 TABLET BY MOUTH ONCE DAILY ON AN EMPTY STOMACH. WAIT 30 MINUTES BEFORE TAKING OTHER MEDS. 90 tablet 1  . omega-3 fatty acids/fish oil 340-1,000 mg capsule Take 1 capsule by mouth every evening  . omeprazole (PRILOSEC) 20 MG DR capsule TAKE 1 CAPSULE BY MOUTH ONCE EVERY MORNING 90 capsule 1  . red yeast rice 600 mg Cap Take 1,200 mg by mouth nightly.  . turmeric-herbal complex no.278 150 mg Cap Take  2 tablets by mouth every evening  . VIT B COMPLEX 100 NO.2/HERBS (VITAMIN B COMPLEX 100 2-HERBS ORAL) Take 1 tablet by mouth every evening  . vitamin E 400 UNIT capsule Take 400 Units by mouth every evening   No current facility-administered medications for this visit.   Allergies: Patient has no known allergies.  Social and Family History  Social  History reports that she has never smoked. She has never used smokeless tobacco. She reports that she does not drink alcohol and does not use drugs.  Family History Family History  Problem Relation Age of Onset  . Scleroderma Mother  . Osteoporosis (Thinning of bones) Mother  . Myocardial Infarction (Heart attack) Father  . Brain cancer Sister  . Myocardial Infarction (Heart attack) Brother  . Colon cancer Neg Hx  . Colon polyps Neg Hx  . Liver disease Neg Hx  . Ulcers Neg Hx   Review of Systems  Review of Systems  Constitutional: Negative for chills, diaphoresis, fever, malaise/fatigue and weight loss.  HENT: Negative for congestion, ear discharge, hearing loss and tinnitus.  Eyes: Negative for blurred vision.  Respiratory: Negative for cough, hemoptysis, sputum production, shortness of breath and wheezing.  Cardiovascular: Negative for chest pain, palpitations, orthopnea, claudication, leg swelling and PND.  Gastrointestinal: Negative for abdominal pain, blood in stool, constipation, diarrhea, heartburn, melena, nausea and vomiting.  Genitourinary: Negative for dysuria, frequency, hematuria and urgency.  Musculoskeletal: Negative for back pain, falls, joint pain and myalgias.  Skin: Negative for itching and rash.  Neurological: Negative for dizziness, tingling, focal weakness, loss of consciousness, weakness and headaches.  Endo/Heme/Allergies: Negative for polydipsia. Does not bruise/bleed easily.  Psychiatric/Behavioral: Negative for depression, memory loss and substance abuse. The patient is not nervous/anxious.   Physical Examination   Vitals:BP 110/62  Pulse 67  Ht 160 cm (5\' 3" )  Wt 56.6 kg (124 lb 12.8 oz)  SpO2 100%  BMI 22.11 kg/m  Ht:160 cm (5\' 3" ) Wt:56.6 kg (124 lb 12.8 oz) JGO:TLXB surface area is 1.59 meters squared. Body mass index is 22.11 kg/m.  Wt Readings from Last 3 Encounters:  07/24/19 56.6 kg (124 lb 12.8 oz)  07/03/19 56.3 kg (124 lb 1.9 oz)   06/18/19 56.3 kg (124 lb 3.2 oz)   BP Readings from Last 3 Encounters:  07/24/19 110/62  06/18/19 122/70  05/11/19 126/70   General appearance appears in no acute distress  Head Mouth and Eye exam Normocephalic, without obvious abnormality, atraumatic Dentition is good Eyes appear anicteric       LUNGS Breath Sounds: Normal Percussion: Normal  CARDIOVASCULAR JVP CV wave: no HJR: no Elevation at 90 degrees: None Carotid Pulse: normal pulsation bilaterally Bruit: None Apex: apical impulse normal  Auscultation Rhythm: normal sinus rhythm S1: normal S2: normal Clicks: no Rub: no Murmurs: no murmurs  Gallop: None  EXTREMITIES Clubbing: no Edema: trace to 1+ bilateral pedal edema Pulses: peripheral pulses symmetrical Femoral Bruits: no Amputation: no SKIN Rash: no Cyanosis: no Embolic phemonenon: no Bruising: no NEURO Alert and Oriented to person, place and time: yes Non focal: yes  PSYCH: Pt appears to have normal affect  LABS REVIEWED Last 3 CBC results: Lab Results  Component Value Date  WBC 5.3 05/11/2019  WBC 4.9 04/04/2019  WBC 4.7 10/02/2018   Lab Results  Component Value Date  HGB 13.4 05/11/2019  HGB 13.1 04/04/2019  HGB 13.3 10/02/2018   Lab Results  Component Value Date  HCT 41.5 05/11/2019  HCT 40.3 04/04/2019  HCT  41.0 10/02/2018   Lab Results  Component Value Date  PLT 489 (H) 05/11/2019  PLT 469 (H) 04/04/2019  PLT 493 (H) 10/02/2018   Lab Results  Component Value Date  CREATININE 0.9 05/11/2019  BUN 15 05/11/2019  NA 139 05/11/2019  K 4.5 05/11/2019  CL 101 05/11/2019  CO2 31.4 05/11/2019   No results found for: HGBA1C  Lab Results  Component Value Date  HDL 101.4 (H) 04/04/2019  HDL 86.4 (H) 10/02/2018  HDL 94.7 (H) 03/24/2018   Lab Results  Component Value Date  LDLCALC 112 04/04/2019  LDLCALC 113 10/02/2018  LDLCALC 117 03/24/2018   Lab Results  Component Value Date  TRIG 121 04/04/2019   TRIG 129 10/02/2018  TRIG 80 03/24/2018   Lab Results  Component Value Date  ALT 12 05/11/2019  AST 18 05/11/2019  ALKPHOS 52 05/11/2019   Lab Results  Component Value Date  TSH 2.471 04/04/2019   Diagnostic Studies Reviewed:  EKG EKG demonstrated normal EKG, normal sinus rhythm.  CT Imaging Chest CT: CT showed calcium score in the mid 200s..  Assessment and Plan   79 y.o. female with  ICD-10-CM  1. Cerebrovascular disease-clinically stable. Is been on clopidogrel and aspirin. We will continue with this. I67.9  2. High coronary artery calcium score and some symptoms with strong family history. ETT and echo were unremarkable. We will continue with aggressive risk factor modification with her current regimen. Daily exercise is recommended. R93.1  3. Chest pain with moderate risk of acute coronary syndrome if and as per above. LDL is 110. HDL is over 100. Continue with red yeast rice R07.9  4. Dyspnea, unspecified type R06.00   Return in about 1 year (around 07/23/2020).  These notes generated with voice recognition software. I apologize for typographical errors.  Sydnee Levans, MD    Electronically signed by Sydnee Levans, MD at 07/24/2019 1:20 PM EDT  Plan of Treatment - documented as of this encounter Upcoming Encounters Upcoming Encounters  Date Type Specialty Care Team Description  10/09/2019 Ancillary Orders Lab Barnabas Lister, MD  [address] [facility] Internal Medicine  [place], [place] [address]  [phone]  [phone] (Fax)    10/15/2019 Office Visit Internal Medicine Baldemar Lenis, Jacqulyn Liner, MD  [phone] [facility] Internal Medicine  [place], [place] [address]  [phone]  [phone] (Fax)    Goals - documented as of this encounter Goal Patient Goal Type Associated Problems Recent Progress Patient-Stated? Author  Patient Goals  General  On track (04/07/2018  10:17 AM EST) Yes Babaoff, Jacqulyn Liner, MD  Note:   Formatting of this note might be different from the original. Trying to read through the Bible   Visit Diagnoses - documented in this encounter Diagnosis  Atypical chest pain - Primary  Other chest pain   Images Patient Demographics  Patient Address Communication Language Race / Ethnicity Marital Status  [place], [place] [address] [phone] [facility]) [phone] (Mobile) [email or web address] English (Preferred) White / Not Hispanic or Latino Married  Patient Contacts  Contact Name Contact Address Communication Relationship to Patient  Rodney Cassandra Johns Unknown [phone] [facility]) Spouse, Emergency Contact  Bonnee Quin Unknown [phone] [facility]) [phone] (Home) Brother or Sister, Emergency Contact  Document Information  Primary Care Provider Other Service Providers Document Coverage Dates  Barnabas Lister, MD (Apr. 18, 2015April 18, 2015 - Present) [phone] (Work) [phone] (Fax) [phone]  [place] and Internal Medicine [place], [place] [address] Family Medicine [facility] [address] [place], [place] [address]  Jun. 01, 2021June 01, 2021 - Jun. 05, 2021June 05, 2021   [place] Organization  [facility] [address] [place], [place] [address]   Encounter Providers Encounter Date  Sydnee Levans, MD (Attending) [phone] (Work) [phone] (Fax) [place], [place] [address] Cardiovascular Disease Jun. 01, 2021June 01, 2021 - Jun. 05, 2021June 05, 2021    Show All Sections

## 2019-12-06 NOTE — Pre-Procedure Instructions (Signed)
ECG 12-lead  Component 5 mo ago  Vent Rate (bpm)  72     PR Interval (msec)  142     QRS Interval (msec)  72     QT Interval (msec)  392     QTc (msec)  429     Resulting Agency [facility] RESULTS  Narrative Performed by Terald Sleeper [place] This result has an attachment that is not available.  Normal sinus rhythm  Normal ECG  No previous ECGs available  I reviewed and concur with this report. Electronically signed NG:BMBO MD, KEN [phone]) on 06/20/2019 8:17:59 AM Specimen Collected: 06/18/19 10:42 AM Last Resulted: 06/18/19 10:42 AM  Received From: [place]  Result Received: 09/26/19 11:35 AM

## 2019-12-06 NOTE — Patient Instructions (Addendum)
Your procedure is scheduled on:12-14-19 FRIDAY Report to Day Surgery on the 2nd floor of the [facility]. To find out your arrival time, please call [phone] between 1PM - 3PM on:12-12-20 THURSDAY  REMEMBER: Instructions that are not followed completely may result in serious medical risk, up to and including death; or upon the discretion of your surgeon and anesthesiologist your surgery may need to be rescheduled.  Do not eat food after midnight the night before surgery.  No gum chewing, lozengers or hard candies.  You may however, drink CLEAR liquids up to 2 hours before you are scheduled to arrive for your surgery. Do not drink anything within 2 hours of your scheduled arrival time.  Clear liquids include: - water  - apple juice without pulp - gatorade (not RED) - black coffee or tea (Do NOT add milk or creamers to the coffee or tea) Do NOT drink anything that is not on this list.  In addition, your doctor has ordered for you to drink the provided  Gatorade G2 Drinking this carbohydrate drink up to two hours before surgery helps to reduce insulin resistance and improve patient outcomes. Please complete drinking 2 hours prior to scheduled arrival time.  TAKE THESE MEDICATIONS THE MORNING OF SURGERY WITH A SIP OF WATER: -SYNTHROID (LEOVTHYROXINE) -MYRBETRIQ -PRILOSEC (OMEPRAZOLE)-take one the night before and one on the morning of surgery - helps to prevent nausea after surgery  Follow recommendations from Cardiologist, Pulmonologist or PCP regarding stopping Aspirin, Coumadin, Plavix, Eliquis, Pradaxa, or Pletal-CALL DR WARD'S OFFICE TODAY ABOUT WHEN TO STOP YOUR ASPIRIN AND PLAVIX  One week prior to surgery: Stop Anti-inflammatories (NSAIDS) such as Advil, Aleve, Ibuprofen, Motrin, Naproxen, Naprosyn and Aspirin based products such as Excedrin, Goodys Powder, BC Powder-OK TO TAKE TYLENOL IF NEEDED  Stop ANY OVER THE COUNTER supplements until after surgery-STOP YOUR ALPHA  LIPOIC ACID, VITAMIN E, FISH OIL, TURMERIC, CO Q-10, ARTERIAL PROTECT, BONE UP AND RED YEAST RICE NOW-YOU MAY RESUME THESE AFTER SURGERY (You may continue taking Vitamin D,Probiotic, Magnesium and B Complex-Do not take these the morning of surgery)  No Alcohol for 24 hours before or after surgery.  No Smoking including e-cigarettes for 24 hours prior to surgery.  No chewable tobacco products for at least 6 hours prior to surgery.  No nicotine patches on the day of surgery.  Do not use any "recreational" drugs for at least a week prior to your surgery.  Please be advised that the combination of cocaine and anesthesia may have negative outcomes, up to and including death. If you test positive for cocaine, your surgery will be cancelled.  On the morning of surgery brush your teeth with toothpaste and water, you may rinse your mouth with mouthwash if you wish. Do not swallow any toothpaste or mouthwash.  Do not wear jewelry, make-up, hairpins, clips or nail polish.  Do not wear lotions, powders, or perfumes.   Do not shave 48 hours prior to surgery.   Contact lenses, hearing aids and dentures may not be worn into surgery.  Do not bring valuables to the hospital. [facility] is not responsible for any missing/lost belongings or valuables.   Notify your doctor if there is any change in your medical condition (cold, fever, infection).  Wear comfortable clothing (specific to your surgery type) to the hospital.  Plan for stool softeners for home use; pain medications have a tendency to cause constipation. You can also help prevent constipation by eating foods high in fiber such as fruits  and vegetables and drinking plenty of fluids as your diet allows.  After surgery, you can help prevent lung complications by doing breathing exercises.  Take deep breaths and cough every 1-2 hours. Your doctor may order a device called an Incentive Spirometer to help you take deep breaths. When coughing or  sneezing, hold a pillow firmly against your incision with both hands. This is called "splinting." Doing this helps protect your incision. It also decreases belly discomfort.  If you are being admitted to the hospital overnight, leave your suitcase in the car. After surgery it may be brought to your room.  If you are being discharged the day of surgery, you will not be allowed to drive home. You will need a responsible adult (18 years or older) to drive you home and stay with you that night.   If you are taking public transportation, you will need to have a responsible adult (18 years or older) with you. Please confirm with your physician that it is acceptable to use public transportation.   Please call the [facility] Dept. at [phone] if you have any questions about these instructions.  Visitation Policy:  Patients undergoing a surgery or procedure may have one family member or support person with them as long as that person is not COVID-19 positive or experiencing its symptoms.  That person may remain in the waiting area during the procedure.  Inpatient Visitation Update:   In an effort to ensure the safety of our team members and our patients, we are implementing a change to our visitation policy:  Effective Monday, Aug. 9, at 7 a.m., inpatients will be allowed one support person.  o The support person may change daily.  o The support person must pass our screening, gel in and out, and wear a mask at all times, including in the patient's room.  o Patients must also wear a mask when staff or their support person are in the room.  o Masking is required regardless of vaccination status.  Systemwide, no visitors 17 or younger.

## 2019-12-06 NOTE — Pre-Procedure Instructions (Signed)
Echo complete  Narrative Performed by [place]         Cassandra Johns, Cassandra Johns      [facility]                  [id number]      [place] #: [id number]      [address] [place] Johns, [place] [address]    Date: 07/16/2019 01: 05 PM                                Adult  Female  Age: 79 yrs      ECHOCARDIOGRAM REPORT               Outpatient                                Cassandra Johns    STUDY:CHEST WALL        TAPE:          Cassandra Johns: Cassandra Johns    ECHO:Yes  DOPPLER:Yes    FILE:          BP: 122/70 mmHg    COLOR:Yes  CONTRAST:No   MACHINE:Philips  RV BIOPSY:No     3D:No SOUND QLTY:Moderate      Height: 62 in   MEDIUM:None                       Weight: 124 lb                                BSA: 1.6 m2  _________________________________________________________________________________________        HISTORY: DOE         REASON: Assess, LV function       INDICATION: Dyspnea, unspecified type [R06.00 (ICD-10-CM)]  _________________________________________________________________________________________  ECHOCARDIOGRAPHIC MEASUREMENTS  2D DIMENSIONS  AORTA         Values  Normal Range  MAIN PA     Values  Normal Range        Annulus: 1.7 cm    [2.1-2.5]     PA Main: nm*    [1.5-2.1]       Aorta Sin: 2.8 cm    [2.7-3.3]  RIGHT VENTRICLE      ST Junction: nm*     [2.3-2.9]     RV Base: nm*    [<4.2]       Asc.Aorta: nm*     [2.3-3.1]     RV Mid: 2.4 cm  [<3.5]  LEFT VENTRICLE                  RV Length: nm*    [<8.6]          LVIDd: 4.3 cm    [3.9-5.3]  INFERIOR VENA CAVA         LVIDs: 2.9 cm            Max. IVC: nm*    [<=2.1]           FS: 33.9 %    [>25]      Min. IVC: nm*  SWT: 1.2 cm    [0.5-0.9]  ------------------          PWT: 1.00 cm   [0.5-0.9]  nm* - not measured  LEFT ATRIUM        LA Diam: 3.5 cm    [2.7-3.8]      LA A4C Area: nm*    [<20]       LA Volume: nm*     [22-52]  _________________________________________________________________________________________  ECHOCARDIOGRAPHIC DESCRIPTIONS  AORTIC ROOT          Size: Normal       Dissection: INDETERM FOR DISSECTION  AORTIC VALVE        Leaflets: Tricuspid          Morphology: Normal        Mobility: Fully mobile  LEFT VENTRICLE          Size: Normal            Anterior: Normal      Contraction:Normal             Lateral: Normal       Closest EF: >55% (Estimated)        Septal: Normal       LV Masses: No Masses            Apical: Normal          LVH: MILD LVH           Inferior: Normal                           Posterior: Normal      Dias.FxClass: N/A  MITRAL VALVE        Leaflets: Normal            Mobility: Fully mobile       Morphology: Normal  LEFT ATRIUM         Size: Normal            LA Masses: No masses       IA Septum: Normal IAS  MAIN PA          Size: Normal  PULMONIC VALVE       Morphology: Normal            Mobility: Fully mobile  RIGHT VENTRICLE       RV Masses: No Masses             Size: Normal       Free Wall: Normal           Contraction: Normal  TRICUSPID VALVE        Leaflets: Normal             Mobility: Fully mobile       Morphology: Normal  RIGHT ATRIUM          Size: Normal            RA Other: None        RA Mass: No masses  PERICARDIUM         Fluid: No effusion  INFERIOR VENACAVA          Size: Normal Normal respiratory collapse  _________________________________________________________________________________________   DOPPLER ECHO and OTHER SPECIAL PROCEDURES         Aortic: MILD AR          No AS             139.1 cm/sec peak vel   7.7 mmHg peak  grad         Mitral: MILD MR          No MS             3.0 cm^2 by DOPPLER             MV Inflow E Vel = 73.7 cm/sec   MV Annulus E'Vel = 5.2 cm/sec             E/E'Ratio = 14.2       Tricuspid: MILD TR          No TS             266.7 cm/sec peak TR vel  31.5 mmHg peak RV pressure       Pulmonary: MILD PR          No PS             85.5 cm/sec peak vel    2.9 mmHg peak grad  _________________________________________________________________________________________  INTERPRETATION  NORMAL LEFT VENTRICULAR SYSTOLIC FUNCTION  WITH MILD LVH  NORMAL RIGHT VENTRICULAR SYSTOLIC FUNCTION  MILD VALVULAR REGURGITATION (See above)  NO VALVULAR STENOSIS  MILD AR, MR, TR, PR  EF 60%  Closest EF: >55% (Estimated)  LVH: MILD LVH  Aortic: MILD AR  Mitral: MILD MR  Tricuspid: MILD TR  _________________________________________________________________________________________  Electronically signed by      MD Cassandra Johns on 07/16/2019 04: 40 PM      Performed By: Cassandra Johns, RDCS   Ordering Physician: Cassandra Johns  _________________________________________________________________________________________ Procedure Note  Interface, Text Results In - 07/16/2019 4:40 PM EDT  Formatting of this note might be different  from the original.             CARDIOLOGY DEPARTMENT         Cassandra Johns, Cassandra Johns #: [id number]       [place], [place], [place] [address]    Date: 07/16/2019 01: 05 PM                                Adult  Female Age: 89 yrs       ECHOCARDIOGRAM REPORT               Outpatient                                [facility]    STUDY:CHEST WALL        TAPE:          Cassandra Johns: Cassandra Johns     ECHO:Yes  DOPPLER:Yes    FILE:          BP: 122/70 mmHg    COLOR:Yes  CONTRAST:No   MACHINE:Philips  RV BIOPSY:No     3D:No SOUND QLTY:Moderate      Height: 62 in    MEDIUM:None                      Weight: 124 lb  BSA: 1.6 m2  _________________________________________________________________________________________         HISTORY: DOE         REASON: Assess, LV function       INDICATION: Dyspnea, unspecified type [R06.00 (ICD-10-CM)]  _________________________________________________________________________________________  ECHOCARDIOGRAPHIC MEASUREMENTS  2D DIMENSIONS  AORTA         Values  Normal Range MAIN PA     Values  Normal Range         Annulus: 1.7 cm    [2.1-2.5]     PA Main: nm*    [1.5-2.1]        Aorta Sin: 2.8 cm    [2.7-3.3]  RIGHT VENTRICLE       ST Junction: nm*     [2.3-2.9]     RV Base: nm*    [<4.2]        Asc.Aorta: nm*     [2.3-3.1]     RV Mid: 2.4 cm  [<3.5]  LEFT VENTRICLE                   RV Length: nm*    [<8.6]          LVIDd: 4.3 cm    [3.9-5.3]  INFERIOR  VENA CAVA         LVIDs: 2.9 cm            Max. IVC: nm*    [<=2.1]           FS: 33.9 %    [>25]      Min. IVC: nm*           SWT: 1.2 cm    [0.5-0.9]  ------------------           PWT: 1.00 cm   [0.5-0.9]  nm* - not measured  LEFT ATRIUM         LA Diam: 3.5 cm    [2.7-3.8]       LA A4C Area: nm*     [<20]        LA Volume: nm*     [22-52]  _________________________________________________________________________________________  ECHOCARDIOGRAPHIC DESCRIPTIONS  AORTIC ROOT          Size: Normal       Dissection: INDETERM FOR DISSECTION  AORTIC VALVE        Leaflets: Tricuspid          Morphology: Normal        Mobility: Fully mobile  LEFT VENTRICLE          Size: Normal            Anterior: Normal       Contraction: Normal             Lateral: Normal       Closest EF: >55% (Estimated)        Septal: Normal        LV Masses: No Masses            Apical: Normal           LVH: MILD LVH           Inferior: Normal                            Posterior: Normal      Dias.FxClass: N/A  MITRAL VALVE        Leaflets: Normal            Mobility: Fully mobile  Morphology: Normal  LEFT ATRIUM          Size: Normal            LA Masses: No masses        IA Septum: Normal IAS  MAIN PA          Size: Normal  PULMONIC VALVE       Morphology: Normal            Mobility: Fully mobile  RIGHT VENTRICLE        RV Masses: No Masses             Size: Normal        Free Wall: Normal           Contraction: Normal  TRICUSPID VALVE        Leaflets: Normal            Mobility: Fully mobile       Morphology:  Normal  RIGHT ATRIUM          Size: Normal            RA Other: None         RA Mass: No masses  PERICARDIUM         Fluid: No effusion  INFERIOR VENACAVA          Size: Normal Normal respiratory collapse  _________________________________________________________________________________________   DOPPLER ECHO and OTHER SPECIAL PROCEDURES        Aortic: MILD AR          No AS             139.1 cm/sec peak vel   7.7 mmHg peak grad         Mitral: MILD MR          No MS             3.0 cm^2 by DOPPLER             MV Inflow E Vel = 73.7 cm/sec   MV Annulus E'Vel = 5.2 cm/sec             E/E'Ratio = 14.2        Tricuspid: MILD TR          No TS             266.7 cm/sec peak TR vel  31.5 mmHg peak RV pressure        Pulmonary: MILD PR          No PS             85.5 cm/sec peak vel    2.9 mmHg peak grad  _________________________________________________________________________________________  INTERPRETATION  NORMAL LEFT VENTRICULAR SYSTOLIC FUNCTION WITH MILD LVH  NORMAL RIGHT VENTRICULAR SYSTOLIC FUNCTION  MILD VALVULAR REGURGITATION (See above)  NO VALVULAR STENOSIS  MILD AR, MR, TR, PR  EF 60%  Closest EF: >55% (Estimated)  LVH: MILD LVH  Aortic: MILD AR  Mitral: MILD MR  Tricuspid: MILD TR  _________________________________________________________________________________________  Electronically signed by      MD Murata Johns on 07/16/2019 04: 40 PM      Performed By: Cassandra Johns, RDCS   Ordering Physician: Cassandra Johns  _________________________________________________________________________________________ All Measurements  Specimen  Collected: 07/16/19 1:05 PM Last Resulted: 07/16/19 4:40 PM  Received From: [place]  Result Received: 09/26/19 11:35 AM  Encounter Summary

## 2019-12-12 ENCOUNTER — Other Ambulatory Visit
Admission: RE | Admit: 2019-12-12 | Discharge: 2019-12-12 | Disposition: A | Discharge status: Home / Self Care | Payer: Medicare Other | Source: Ambulatory Visit | Attending: Obstetrics & Gynecology | Admitting: Obstetrics & Gynecology

## 2019-12-12 ENCOUNTER — Other Ambulatory Visit: Payer: Self-pay

## 2019-12-12 DIAGNOSIS — Z01812 Encounter for preprocedural laboratory examination: Secondary | ICD-10-CM | POA: Insufficient documentation

## 2019-12-12 DIAGNOSIS — Z20822 Contact with and (suspected) exposure to covid-19: Secondary | ICD-10-CM | POA: Insufficient documentation

## 2019-12-12 LAB — CBC
HCT: 39.2 % (ref 36.0–46.0)
Hemoglobin: 12.9 g/dL (ref 12.0–15.0)
MCH: 31.8 pg (ref 26.0–34.0)
MCHC: 32.9 g/dL (ref 30.0–36.0)
MCV: 96.6 fL (ref 80.0–100.0)
Platelets: 609 10*3/uL — ABNORMAL HIGH (ref 150–400)
RBC: 4.06 MIL/uL (ref 3.87–5.11)
RDW: 13.8 % (ref 11.5–15.5)
WBC: 5.6 10*3/uL (ref 4.0–10.5)
nRBC: 0 % (ref 0.0–0.2)

## 2019-12-12 LAB — BASIC METABOLIC PANEL
Anion gap: 10 (ref 5–15)
BUN: 15 mg/dL (ref 8–23)
CO2: 28 mmol/L (ref 22–32)
Calcium: 9.4 mg/dL (ref 8.9–10.3)
Chloride: 100 mmol/L (ref 98–111)
Creatinine, Ser: 1 mg/dL (ref 0.44–1.00)
GFR, Estimated: 54 mL/min — ABNORMAL LOW (ref >60)
Glucose, Bld: 77 mg/dL (ref 70–99)
Potassium: 3.8 mmol/L (ref 3.5–5.1)
Sodium: 138 mmol/L (ref 135–145)

## 2019-12-12 LAB — SARS CORONAVIRUS 2 (TAT 6-24 HRS): SARS Coronavirus 2: NEGATIVE

## 2019-12-13 LAB — TYPE AND SCREEN
ABO/RH(D): A POS
Antibody Screen: NEGATIVE

## 2019-12-13 MED ORDER — KETOROLAC TROMETHAMINE 15 MG/ML IJ SOLN: 15.0000 mg | INTRAMUSCULAR | Status: AC

## 2019-12-13 MED ORDER — ORAL CARE MOUTH RINSE: 15.0000 mL | Freq: Once | OROMUCOSAL | Status: AC

## 2019-12-13 MED ORDER — SCOPOLAMINE 1 MG/3DAYS TD PT72: 1.0000 | MEDICATED_PATCH | TRANSDERMAL | Status: DC

## 2019-12-13 MED ORDER — POVIDONE-IODINE 10 % EX SWAB: 2.0000 "application " | Freq: Once | CUTANEOUS | Status: DC

## 2019-12-13 MED ORDER — CHLORHEXIDINE GLUCONATE 0.12 % MT SOLN
15.0000 mL | Freq: Once | OROMUCOSAL | Status: AC
  Administered 2019-12-14: 15 mL via OROMUCOSAL

## 2019-12-13 MED ORDER — LACTATED RINGERS IV SOLN: INTRAVENOUS | Status: DC

## 2019-12-13 MED ORDER — DEXAMETHASONE SODIUM PHOSPHATE 10 MG/ML IJ SOLN: 4.0000 mg | INTRAMUSCULAR | Status: AC

## 2019-12-13 MED ORDER — ACETAMINOPHEN 500 MG PO TABS: 1000.0000 mg | ORAL_TABLET | ORAL | Status: AC

## 2019-12-13 MED ORDER — ENOXAPARIN SODIUM 40 MG/0.4ML ~~LOC~~ SOLN: 40.0000 mg | SUBCUTANEOUS | Status: AC

## 2019-12-13 MED ORDER — CEFAZOLIN SODIUM-DEXTROSE 2-4 GM/100ML-% IV SOLN
2.0000 g | INTRAVENOUS | Status: AC
  Administered 2019-12-14: 2 g via INTRAVENOUS

## 2019-12-13 MED ORDER — GABAPENTIN 300 MG PO CAPS: 600.0000 mg | ORAL_CAPSULE | ORAL | Status: AC

## 2019-12-14 ENCOUNTER — Other Ambulatory Visit: Payer: Self-pay

## 2019-12-14 ENCOUNTER — Encounter
Admission: RE | Disposition: A | Discharge status: Home / Self Care | Payer: Self-pay | Source: Home / Self Care | Attending: Obstetrics & Gynecology

## 2019-12-14 ENCOUNTER — Observation Stay: Payer: Medicare Other | Admitting: Registered Nurse

## 2019-12-14 ENCOUNTER — Encounter: Payer: Self-pay | Admitting: Obstetrics & Gynecology

## 2019-12-14 ENCOUNTER — Observation Stay
Admission: RE | Admit: 2019-12-14 | Discharge: 2019-12-14 | Disposition: A | Discharge status: Home / Self Care | Payer: Medicare Other | Attending: Obstetrics & Gynecology | Admitting: Obstetrics & Gynecology

## 2019-12-14 DIAGNOSIS — Z79899 Other long term (current) drug therapy: Secondary | ICD-10-CM | POA: Insufficient documentation

## 2019-12-14 DIAGNOSIS — E039 Hypothyroidism, unspecified: Secondary | ICD-10-CM | POA: Insufficient documentation

## 2019-12-14 DIAGNOSIS — N814 Uterovaginal prolapse, unspecified: Secondary | ICD-10-CM | POA: Diagnosis present

## 2019-12-14 DIAGNOSIS — Z7982 Long term (current) use of aspirin: Secondary | ICD-10-CM | POA: Insufficient documentation

## 2019-12-14 DIAGNOSIS — N993 Prolapse of vaginal vault after hysterectomy: Principal | ICD-10-CM | POA: Insufficient documentation

## 2019-12-14 DIAGNOSIS — I10 Essential (primary) hypertension: Secondary | ICD-10-CM | POA: Diagnosis not present

## 2019-12-14 DIAGNOSIS — Z8673 Personal history of transient ischemic attack (TIA), and cerebral infarction without residual deficits: Secondary | ICD-10-CM | POA: Insufficient documentation

## 2019-12-14 HISTORY — PX: CYSTOCELE REPAIR: SHX163

## 2019-12-14 LAB — ABO/RH: ABO/RH(D): A POS

## 2019-12-14 SURGERY — COLPORRHAPHY, ANTERIOR, FOR CYSTOCELE REPAIR
Anesthesia: General

## 2019-12-14 MED ORDER — FENTANYL CITRATE (PF) 100 MCG/2ML IJ SOLN: 25.0000 ug | INTRAMUSCULAR | Status: DC | PRN

## 2019-12-14 MED ORDER — DEXAMETHASONE SODIUM PHOSPHATE 10 MG/ML IJ SOLN
INTRAMUSCULAR | Status: AC
  Administered 2019-12-14: 4 mg via INTRAVENOUS
  Filled 2019-12-14: qty 1

## 2019-12-14 MED ORDER — ESTROGENS, CONJUGATED 0.625 MG/GM VA CREA
TOPICAL_CREAM | VAGINAL | Status: DC | PRN
  Administered 2019-12-14: 1 via VAGINAL

## 2019-12-14 MED ORDER — SODIUM CHLORIDE (PF) 0.9 % IJ SOLN
INTRAMUSCULAR | Status: AC
  Filled 2019-12-14: qty 10

## 2019-12-14 MED ORDER — LIDOCAINE-EPINEPHRINE 1 %-1:100000 IJ SOLN
INTRAMUSCULAR | Status: AC
  Filled 2019-12-14: qty 1

## 2019-12-14 MED ORDER — ENSURE PRE-SURGERY PO LIQD
296.0000 mL | Freq: Once | ORAL | Status: DC
  Filled 2019-12-14: qty 296

## 2019-12-14 MED ORDER — OXYCODONE HCL 5 MG PO TABS
5.0000 mg | ORAL_TABLET | Freq: Three times a day (TID) | ORAL | 0 refills | Status: DC | PRN
Start: 2019-12-14 — End: 2020-04-29

## 2019-12-14 MED ORDER — FENTANYL CITRATE (PF) 100 MCG/2ML IJ SOLN
INTRAMUSCULAR | Status: DC | PRN
  Administered 2019-12-14: 25 ug via INTRAVENOUS
  Administered 2019-12-14: 50 ug via INTRAVENOUS

## 2019-12-14 MED ORDER — DEXAMETHASONE SODIUM PHOSPHATE 10 MG/ML IJ SOLN
INTRAMUSCULAR | Status: AC
  Filled 2019-12-14: qty 1

## 2019-12-14 MED ORDER — KETAMINE HCL 10 MG/ML IJ SOLN
INTRAMUSCULAR | Status: DC | PRN
  Administered 2019-12-14 (×2): 10 mg via INTRAVENOUS

## 2019-12-14 MED ORDER — LIDOCAINE HCL (PF) 2 % IJ SOLN
INTRAMUSCULAR | Status: AC
  Filled 2019-12-14: qty 10

## 2019-12-14 MED ORDER — KETAMINE HCL 50 MG/ML IJ SOLN
INTRAMUSCULAR | Status: AC
  Filled 2019-12-14: qty 10

## 2019-12-14 MED ORDER — ROCURONIUM BROMIDE 10 MG/ML (PF) SYRINGE
PREFILLED_SYRINGE | INTRAVENOUS | Status: AC
  Filled 2019-12-14: qty 10

## 2019-12-14 MED ORDER — PROPOFOL 10 MG/ML IV BOLUS
INTRAVENOUS | Status: AC
  Filled 2019-12-14: qty 40

## 2019-12-14 MED ORDER — CEFAZOLIN SODIUM-DEXTROSE 2-4 GM/100ML-% IV SOLN
INTRAVENOUS | Status: AC
  Filled 2019-12-14: qty 100

## 2019-12-14 MED ORDER — PHENAZOPYRIDINE HCL 100 MG PO TABS
100.0000 mg | ORAL_TABLET | Freq: Three times a day (TID) | ORAL | Status: DC
  Administered 2019-12-14: 100 mg via ORAL
  Filled 2019-12-14 (×2): qty 1

## 2019-12-14 MED ORDER — IBUPROFEN 600 MG PO TABS
ORAL_TABLET | ORAL | Status: AC
  Administered 2019-12-14: 600 mg
  Filled 2019-12-14: qty 1

## 2019-12-14 MED ORDER — DEXMEDETOMIDINE (PRECEDEX) IN NS 20 MCG/5ML (4 MCG/ML) IV SYRINGE
PREFILLED_SYRINGE | INTRAVENOUS | Status: AC
  Filled 2019-12-14: qty 5

## 2019-12-14 MED ORDER — KETOROLAC TROMETHAMINE 15 MG/ML IJ SOLN
INTRAMUSCULAR | Status: AC
  Administered 2019-12-14: 15 mg via INTRAVENOUS
  Filled 2019-12-14: qty 1

## 2019-12-14 MED ORDER — OXYCODONE HCL 5 MG PO TABS
5.0000 mg | ORAL_TABLET | ORAL | Status: DC | PRN
  Filled 2019-12-14: qty 1

## 2019-12-14 MED ORDER — ESTROGENS, CONJUGATED 0.625 MG/GM VA CREA
TOPICAL_CREAM | VAGINAL | Status: AC
  Filled 2019-12-14: qty 30

## 2019-12-14 MED ORDER — PROPOFOL 500 MG/50ML IV EMUL
INTRAVENOUS | Status: DC | PRN
  Administered 2019-12-14: 25 ug/kg/min via INTRAVENOUS

## 2019-12-14 MED ORDER — PROPOFOL 10 MG/ML IV BOLUS
INTRAVENOUS | Status: DC | PRN
  Administered 2019-12-14: 140 mg via INTRAVENOUS

## 2019-12-14 MED ORDER — IBUPROFEN 600 MG PO TABS: 600.0000 mg | ORAL_TABLET | Freq: Four times a day (QID) | ORAL | 0 refills | Status: DC

## 2019-12-14 MED ORDER — ACETAMINOPHEN 500 MG PO TABS
1000.0000 mg | ORAL_TABLET | Freq: Four times a day (QID) | ORAL | Status: DC
  Administered 2019-12-14: 1000 mg via ORAL
  Filled 2019-12-14: qty 2

## 2019-12-14 MED ORDER — DEXAMETHASONE SODIUM PHOSPHATE 10 MG/ML IJ SOLN
INTRAMUSCULAR | Status: DC | PRN
  Administered 2019-12-14: 6 mg via INTRAVENOUS

## 2019-12-14 MED ORDER — FUROSEMIDE 10 MG/ML IJ SOLN
10.0000 mg | Freq: Once | INTRAMUSCULAR | Status: AC
  Administered 2019-12-14: 10 mg via INTRAVENOUS

## 2019-12-14 MED ORDER — PHENAZOPYRIDINE HCL 100 MG PO TABS
100.0000 mg | ORAL_TABLET | Freq: Three times a day (TID) | ORAL | 0 refills | Status: DC
Start: 2019-12-14 — End: 2020-04-29

## 2019-12-14 MED ORDER — ONDANSETRON HCL 4 MG/2ML IJ SOLN: 4.0000 mg | Freq: Once | INTRAMUSCULAR | Status: DC | PRN

## 2019-12-14 MED ORDER — DEXMEDETOMIDINE HCL 200 MCG/2ML IV SOLN
INTRAVENOUS | Status: DC | PRN
  Administered 2019-12-14: 4 ug via INTRAVENOUS
  Administered 2019-12-14 (×2): 8 ug via INTRAVENOUS
  Administered 2019-12-14: 12 ug via INTRAVENOUS

## 2019-12-14 MED ORDER — ENOXAPARIN SODIUM 40 MG/0.4ML ~~LOC~~ SOLN
SUBCUTANEOUS | Status: AC
  Administered 2019-12-14: 40 mg via SUBCUTANEOUS
  Filled 2019-12-14: qty 0.4

## 2019-12-14 MED ORDER — SUGAMMADEX SODIUM 200 MG/2ML IV SOLN
INTRAVENOUS | Status: DC | PRN
  Administered 2019-12-14: 200 mg via INTRAVENOUS

## 2019-12-14 MED ORDER — FUROSEMIDE 10 MG/ML IJ SOLN
INTRAMUSCULAR | Status: AC
  Filled 2019-12-14: qty 2

## 2019-12-14 MED ORDER — ONDANSETRON HCL 4 MG/2ML IJ SOLN
INTRAMUSCULAR | Status: AC
  Filled 2019-12-14: qty 2

## 2019-12-14 MED ORDER — NITROFURANTOIN MONOHYD MACRO 100 MG PO CAPS: 100.0000 mg | ORAL_CAPSULE | Freq: Every day | ORAL | 0 refills | Status: AC

## 2019-12-14 MED ORDER — GABAPENTIN 300 MG PO CAPS
ORAL_CAPSULE | ORAL | Status: AC
  Administered 2019-12-14: 600 mg via ORAL
  Filled 2019-12-14: qty 2

## 2019-12-14 MED ORDER — FENTANYL CITRATE (PF) 100 MCG/2ML IJ SOLN
INTRAMUSCULAR | Status: AC
  Filled 2019-12-14: qty 2

## 2019-12-14 MED ORDER — IBUPROFEN 600 MG PO TABS: 600.0000 mg | ORAL_TABLET | Freq: Four times a day (QID) | ORAL | Status: DC

## 2019-12-14 MED ORDER — HEMOSTATIC AGENTS (NO CHARGE) OPTIME
TOPICAL | Status: DC | PRN
  Administered 2019-12-14: 1 via TOPICAL

## 2019-12-14 MED ORDER — ACETAMINOPHEN 500 MG PO TABS
ORAL_TABLET | ORAL | Status: AC
  Administered 2019-12-14: 1000 mg via ORAL
  Filled 2019-12-14: qty 2

## 2019-12-14 MED ORDER — LIDOCAINE HCL (CARDIAC) PF 100 MG/5ML IV SOSY
PREFILLED_SYRINGE | INTRAVENOUS | Status: DC | PRN
  Administered 2019-12-14: 100 mg via INTRAVENOUS

## 2019-12-14 MED ORDER — ONDANSETRON HCL 4 MG/2ML IJ SOLN
INTRAMUSCULAR | Status: DC | PRN
  Administered 2019-12-14: 4 mg via INTRAVENOUS

## 2019-12-14 MED ORDER — PHENYLEPHRINE HCL (PRESSORS) 10 MG/ML IV SOLN
INTRAVENOUS | Status: DC | PRN
  Administered 2019-12-14 (×2): 50 ug via INTRAVENOUS
  Administered 2019-12-14 (×3): 100 ug via INTRAVENOUS

## 2019-12-14 MED ORDER — KETOROLAC TROMETHAMINE 30 MG/ML IJ SOLN
INTRAMUSCULAR | Status: AC
  Filled 2019-12-14: qty 1

## 2019-12-14 MED ORDER — ROCURONIUM BROMIDE 100 MG/10ML IV SOLN
INTRAVENOUS | Status: DC | PRN
  Administered 2019-12-14: 10 mg via INTRAVENOUS
  Administered 2019-12-14: 50 mg via INTRAVENOUS

## 2019-12-14 MED ORDER — SODIUM CHLORIDE 0.9 % IV SOLN
INTRAVENOUS | Status: DC | PRN
  Administered 2019-12-14: 20 ug/min via INTRAVENOUS

## 2019-12-14 MED ORDER — LIDOCAINE-EPINEPHRINE 1 %-1:100000 IJ SOLN
INTRAMUSCULAR | Status: DC | PRN
  Administered 2019-12-14: 3 mL
  Administered 2019-12-14: 15 mL

## 2019-12-14 MED ORDER — SCOPOLAMINE 1 MG/3DAYS TD PT72
MEDICATED_PATCH | TRANSDERMAL | Status: AC
  Filled 2019-12-14: qty 1

## 2019-12-14 SURGICAL SUPPLY — 35 items
BAG URINE DRAIN 2000ML AR STRL (UROLOGICAL SUPPLIES) IMPLANT
BLADE SURG SZ10 CARB STEEL (BLADE) ×3 IMPLANT
CATH FOLEY 2WAY  5CC 16FR (CATHETERS) ×2
CATH URTH 16FR FL 2W BLN LF (CATHETERS) ×1 IMPLANT
COVER WAND RF STERILE (DRAPES) ×3 IMPLANT
DRAPE 3/4 80X56 (DRAPES) ×3 IMPLANT
DRAPE PERI LITHO V/GYN (MISCELLANEOUS) ×3 IMPLANT
DRAPE UNDER BUTTOCK W/FLU (DRAPES) ×3 IMPLANT
ELECT REM PT RETURN 9FT ADLT (ELECTROSURGICAL) ×3
ELECTRODE REM PT RTRN 9FT ADLT (ELECTROSURGICAL) ×1 IMPLANT
GAUZE 4X4 16PLY RFD (DISPOSABLE) ×6 IMPLANT
GAUZE PACK 2X3YD (PACKING) ×3 IMPLANT
GLOVE PI ORTHOPRO 6.5 (GLOVE) ×8
GLOVE PI ORTHOPRO STRL 6.5 (GLOVE) ×4 IMPLANT
GLOVE SURG SYN 6.5 ES PF (GLOVE) ×12 IMPLANT
GOWN STRL REUS W/ TWL LRG LVL3 (GOWN DISPOSABLE) ×3 IMPLANT
GOWN STRL REUS W/ TWL XL LVL3 (GOWN DISPOSABLE) ×1 IMPLANT
GOWN STRL REUS W/TWL LRG LVL3 (GOWN DISPOSABLE) ×6
GOWN STRL REUS W/TWL XL LVL3 (GOWN DISPOSABLE) ×2
KIT TURNOVER CYSTO (KITS) ×3 IMPLANT
LABEL OR SOLS (LABEL) ×3 IMPLANT
NEEDLE HYPO 22GX1.5 SAFETY (NEEDLE) ×3 IMPLANT
NS IRRIG 500ML POUR BTL (IV SOLUTION) ×3 IMPLANT
PACK BASIN MINOR (MISCELLANEOUS) ×3 IMPLANT
PAD OB MATERNITY 4.3X12.25 (PERSONAL CARE ITEMS) ×3 IMPLANT
PAD PREP 24X41 OB/GYN DISP (PERSONAL CARE ITEMS) ×3 IMPLANT
SUT ETHIBOND NAB CT1 #1 30IN (SUTURE) ×12 IMPLANT
SUT VIC AB 0 CT1 27 (SUTURE) ×2
SUT VIC AB 0 CT1 27XCR 8 STRN (SUTURE) ×1 IMPLANT
SUT VIC AB 2-0 CT1 27 (SUTURE)
SUT VIC AB 2-0 CT1 TAPERPNT 27 (SUTURE) IMPLANT
SUT VIC AB 3-0 SH 27 (SUTURE) ×2
SUT VIC AB 3-0 SH 27X BRD (SUTURE) ×1 IMPLANT
SYR 10ML LL (SYRINGE) ×3 IMPLANT
SYR CONTROL 10ML LL (SYRINGE) ×3 IMPLANT

## 2019-12-14 NOTE — OR Nursing (Signed)
Discharge instructions reviewed with patient and her brother Richardson Landry (premarin cream given with instructions) prior to transport to inpt room.

## 2019-12-14 NOTE — Progress Notes (Signed)
D/C instructions reviewed again with pt and sister. Instructions given on foley cath and home care. Teach back done. IV d/c'd with cath intact. Pt d/c via wheelchair to car with sister.

## 2019-12-14 NOTE — OR Nursing (Addendum)
Per Dr. Leonides Schanz, secure-chat, patient may resume plavix on Sunday.  Added to d/c instructions/med section.  Per patient, she did not stop aspirin prior to surgery, just plavix.  Discussed intake/zero output/bladder scan with Dr. Leonides Schanz via tele, lasix given as ordered.  States may discharge patient to home as soon as patient voids.  Update 1445 - void 75cc, post void residual 680 cc; discussed with Dr. Leonides Schanz.  Will send patient to floor after I/O cath and let floor nurse know patient may go home after she voids there.

## 2019-12-14 NOTE — Transfer of Care (Signed)
Immediate Anesthesia Transfer of Care Note  Patient: Cassandra Johns  Procedure(s) Performed: ANTERIOR REPAIR (CYSTOCELE) (N/A )  Patient Location: PACU  Anesthesia Type:General  Level of Consciousness: drowsy  Airway & Oxygen Therapy: Patient Spontanous Breathing and Patient connected to face mask oxygen  Post-op Assessment: Report given to RN and Post -op Vital signs reviewed and stable  Post vital signs: Reviewed and stable  Last Vitals:  Vitals Value Taken Time  BP 106/53 12/14/19 0943  Temp    Pulse 79 12/14/19 0946  Resp 12 12/14/19 0946  SpO2 98 % 12/14/19 0946  Vitals shown include unvalidated device data.  Last Pain:  Vitals:   12/14/19 0613  TempSrc: Tympanic  PainSc: 0-No pain         Complications: No complications documented.

## 2019-12-14 NOTE — Interval H&P Note (Signed)
History and Physical Interval Note:  12/14/2019 7:21 AM  Cassandra Johns  has presented today for surgery, with the diagnosis of vaginal vault prolapse after hysterectomy.  The various methods of treatment have been discussed with the patient and family. After consideration of risks, benefits and other options for treatment, the patient has consented to  Procedure(s): ANTERIOR REPAIR (CYSTOCELE) (N/A) as a surgical intervention.  The patient's history has been reviewed, patient examined, no change in status, stable for surgery.  I have reviewed the patient's chart and labs.  Questions were answered to the patient's satisfaction.     [place]

## 2019-12-14 NOTE — Discharge Instructions (Signed)
AMBULATORY SURGERY  DISCHARGE INSTRUCTIONS   1) The drugs that you were given will stay in your system until tomorrow so for the next 24 hours you should not:  A) Drive an automobile B) Make any legal decisions C) Drink any alcoholic beverage   2) You may resume regular meals tomorrow.  Today it is better to start with liquids and gradually work up to solid foods.  You may eat anything you prefer, but it is better to start with liquids, then soup and crackers, and gradually work up to solid foods.   3) Please notify your doctor immediately if you have any unusual bleeding, trouble breathing, redness and pain at the surgery site, drainage, fever, or pain not relieved by medication.    4) Additional Instructions:        Please contact your physician with any problems or Same Day Surgery at [phone], Monday through Friday 6 am to 4 pm, or [facility] [place] number at [phone].Discharge instructions:  Call office if you have any of the following: fever >101 F, chills, shortness of breath, excessive vaginal bleeding, incision drainage or problems, leg pain or redness, or any other concerns.   Activity: Do not lift > 8 lbs for 4 weeks. (this is the weight of a gallon of milk) No intercourse or tampons for 8 weeks.  No driving until you are certain you can slam on the brakes, and of course never while taking narcotics.   Place 1g of the premarin cream into your vagina once nightly until the cream runs out.    Take 600mg  Ibuprofen and 1000mg  Tylenol together, around the clock, every 6 hours for at least the first 3-5 days.  After this you can take as needed.  This will help decrease inflammation and promote healing.  The narcotics you'll take just as needed, as they just trick your brain into thinking its not in pain.    Please don't limit yourself in terms of routine activity.  You will be able to do most things, although they may take longer to do or be a little  painful.  You can do it!  Don't be a hero, but don't be a wimp either!

## 2019-12-14 NOTE — Anesthesia Preprocedure Evaluation (Signed)
Anesthesia Evaluation  Patient identified by MRN, date of birth, ID band Patient awake    Reviewed: Allergy & Precautions, NPO status , Patient's Chart, lab work & pertinent test results  History of Anesthesia Complications Negative for: history of anesthetic complications  Airway Mallampati: III       Dental   Pulmonary neg sleep apnea, neg COPD, Not current smoker,           Cardiovascular hypertension, Pt. on medications (-) Past MI and (-) CHF (-) dysrhythmias (-) Valvular Problems/Murmurs     Neuro/Psych TIA (L arm numbness, lasted 30 minutes)   GI/Hepatic Neg liver ROS, GERD  Medicated and Controlled,  Endo/Other  neg diabetesHypothyroidism   Renal/GU negative Renal ROS     Musculoskeletal   Abdominal   Peds  Hematology   Anesthesia Other Findings   Reproductive/Obstetrics                             Anesthesia Physical Anesthesia Plan  ASA: II  Anesthesia Plan: General   Post-op Pain Management:    Induction: Intravenous  PONV Risk Score and Plan: 3 and Ondansetron, Dexamethasone and Treatment may vary due to age or medical condition  Airway Management Planned: Oral ETT  Additional Equipment:   Intra-op Plan:   Post-operative Plan:   Informed Consent: I have reviewed the patients History and Physical, chart, labs and discussed the procedure including the risks, benefits and alternatives for the proposed anesthesia with the patient or authorized representative who has indicated his/her understanding and acceptance.       Plan Discussed with:   Anesthesia Plan Comments:         Anesthesia Quick Evaluation

## 2019-12-14 NOTE — Op Note (Signed)
[facility] PROCEDURE DATE: 12/14/2019  PATIENT:  Cassandra Johns  79 y.o. female  PRE-OPERATIVE DIAGNOSIS:  vaginal vault prolapse after hysterectomy, cystocele  POST-OPERATIVE DIAGNOSIS:  vaginal vault prolapse after hysterectomy, cystocele  PROCEDURE:  Procedure(s): ANTERIOR REPAIR (CYSTOCELE) (N/A)  SURGEON:  Surgeon(s) and Role:    * Kaseem Vastine, Honor Loh, MD - Primary    * Schermerhorn, Gwen Her, MD - Assisting  ANESTHESIA:  General via ET  I/O  Total I/O In: 900 [I.V.:800; IV Piggyback:100] Out: 170 [Urine:150; Blood:20]  FINDINGS:   Grade 4 anterior prolapse, grade 2 apical prolapse and grade 1 posterior prolapse.   Normal vulva. Clear yellow urine before and after procedure.  SPECIMEN: none  COMPLICATIONS: none apparent  DISPOSITION: vital signs stable to PACU   Indication for Surgery: 79 y.o. s/p hysterectomy, with complains of bulge through her vagina, causing discomfort.  She declined non-surgical interventions.   Risks of surgery were discussed with the patient including but not limited to: bleeding which may require transfusion or reoperation; infection which may require antibiotics; injury to bowel, bladder, ureters or other surrounding organs; need for additional procedures including laparotomy, blood clot, incisional problems and other postoperative/anesthesia complications. Written informed consent was obtained.    PROCEDURE IN DETAIL:  The patient had sequential compression devices applied to her lower extremities while in the preoperative area.  She was then taken to the operating room where general anesthesia was administered and was found to be adequate.  She was placed in the dorsal lithotomy position, and was prepped and draped in a sterile manner.  After a surgical timeout was performed, a Foley catheter was inserted into her bladder.  The apical vagina was grasped with alis clamps and a knife was used to divide the tissues.  Lidocaine with epinephrine was  injected to hydro-dissect the vaginal epithelium from the bladder muscularis and fascia.  Metzenbaums were used to further divide these tissues in the midline and laterally.  Once the midline incision was made, the epithelium was retracted laterally, and blunt, cautery, and sharp dissection was performed to separate the bladder from the epithelium bilaterally and inferiorly.  Once the bladder was mobile, 0-Vicryl mattress sutures were placed to plicate the bladder anteriorly. A piece of surgi-cell was placed against the bladder suture line. The epithelium was trimmed and reapproximated with figures-of-eight.  There was no tension or redundant tissue upon completion.    Premarin cream was inserted into the vagina along the suture line.  The patient tolerated the procedures well.  All instruments, needles, and sponge counts were correct x 2. The patient was taken to the recovery room in stable condition.   ---- Larey Days, MD Attending Obstetrician and [facility]

## 2019-12-14 NOTE — OR Nursing (Signed)
700cc I/O cath; iv accidentally fell out of left wrist, new left hand  22g started by Lorie Apley, RN just prior to transfer to rom 348A.

## 2019-12-14 NOTE — Anesthesia Postprocedure Evaluation (Signed)
Anesthesia Post Note  Patient: Cassandra Johns  Procedure(s) Performed: ANTERIOR REPAIR (CYSTOCELE) (N/A )  Patient location during evaluation: PACU Anesthesia Type: General Level of consciousness: awake and alert Pain management: pain level controlled Vital Signs Assessment: post-procedure vital signs reviewed and stable Respiratory status: spontaneous breathing and respiratory function stable Cardiovascular status: stable Anesthetic complications: no   No complications documented.   Last Vitals:  Vitals:   12/14/19 0957 12/14/19 0958  BP:  108/61  Pulse: 86 82  Resp: 17 13  Temp:    SpO2: 98% 95%    Last Pain:  Vitals:   12/14/19 0958  TempSrc:   PainSc: 0-No pain                 Tali Cleaves K

## 2019-12-14 NOTE — Anesthesia Procedure Notes (Signed)
Procedure Name: Intubation Date/Time: 12/14/2019 7:40 AM Performed by: Lia Foyer, CRNA Pre-anesthesia Checklist: Patient identified, Emergency Drugs available, Suction available and Patient being monitored Patient Re-evaluated:Patient Re-evaluated prior to induction Oxygen Delivery Method: Circle system utilized Preoxygenation: Pre-oxygenation with 100% oxygen Induction Type: IV induction Ventilation: Mask ventilation without difficulty Laryngoscope Size: McGraph and 3 Grade View: Grade I Tube type: Oral Tube size: 6.5 mm Number of attempts: 1 Airway Equipment and Method: Stylet,  Oral airway and Video-laryngoscopy Placement Confirmation: ETT inserted through vocal cords under direct vision,  positive ETCO2 and breath sounds checked- equal and bilateral Secured at: 19 cm Tube secured with: Tape Dental Injury: Teeth and Oropharynx as per pre-operative assessment

## 2019-12-15 ENCOUNTER — Encounter: Payer: Self-pay | Admitting: Obstetrics & Gynecology

## 2019-12-19 NOTE — Discharge Summary (Signed)
Gynecology Discharge Summary  Patient ID: Cassandra Johns MRN: [id number] DOB/AGE: 77-Jul-1942 77 y.o.  Admit Date: 12/14/2019 Discharge Date: 12/19/2019  Preoperative Diagnoses: cystocele Postoperative Diagnoses: cystocele  Procedures: Procedure(s) (LRB): ANTERIOR REPAIR (CYSTOCELE) (N/A)  CBC Latest Ref Rng & Units 12/12/2019  WBC 4.0 - 10.5 K/uL 5.6  Hemoglobin 12.0 - 15.0 g/dL 12.9  Hematocrit 36 - 46 % 39.2  Platelets 150 - 400 K/uL 609(H)    Hospital Course:  Cassandra Johns is a 79 y.o. admitted for scheduled surgery.  She underwent the procedures as mentioned above, her operation was uncomplicated. For further details about surgery, please refer to the operative report.  She was doing well in PACU and postop, but had urinary retention with minimal urine output.  She was admitted to the floor and given time to attempt to void.  Her void was again minimal with significant urinary retention on bladder scan.  A foley catheter was placed and she was then able to be discharged.  She was sent home with pyridium, macrobid for UTI prophylaxis, and already ordered pain medications.   Discharge Exam: BP 124/68 (BP Location: Left Arm)   Pulse 79   Temp 97.6 F (36.4 C) (Oral)   Resp 17   Ht 5\' 3"  (1.6 m)   Wt 55.8 kg   SpO2 97%   BMI 21.79 kg/m  General appearance: alert and no distress  Resp: clear to auscultation bilaterally, normal respiratory effort Cardio: regular rate and rhythm  GI: soft, non-tender; bowel sounds normal; no masses, no organomegaly.  Pelvic: scant blood on pad  Extremities: extremities normal, atraumatic, no cyanosis or edema and Homans sign is negative, no sign of DVT  Discharged Condition: Stable  Disposition: Discharge disposition: 01-Home or Self Care        Allergies as of 12/14/2019      Reactions   Lactose Intolerance (gi)       Medication List    STOP taking these medications   amoxicillin 500 MG capsule Commonly known as: AMOXIL    estrogens (conjugated) 0.45 MG tablet Commonly known as: PREMARIN     TAKE these medications   acidophilus Caps capsule Take 1 capsule by mouth daily.   Alpha-Lipoic Acid 600 MG Caps Take 1,200 mg by mouth daily.   aspirin EC 81 MG tablet Take 81 mg by mouth daily. Swallow whole.   b complex vitamins tablet Take 1 tablet by mouth daily.   clopidogrel 75 MG tablet Commonly known as: PLAVIX Take 75 mg by mouth daily.   Coenzyme Q10 400 MG Caps Take 800 mg by mouth daily.   Curcumin 95 500 MG Caps Generic drug: Turmeric Take 1,000 mg by mouth daily. What changed: Another medication with the same name was removed. Continue taking this medication, and follow the directions you see here.   ibuprofen 600 MG tablet Commonly known as: ADVIL Take 1 tablet (600 mg total) by mouth every 6 (six) hours.   lactase 3000 units tablet Commonly known as: LACTAID Take 2 tablets by mouth as needed.   levothyroxine 75 MCG tablet Commonly known as: SYNTHROID Take 75 mcg by mouth daily before breakfast.   MAGNESIUM PO Take 1 tablet by mouth daily.   Myrbetriq 25 MG Tb24 tablet Generic drug: mirabegron ER Take 25 mg by mouth daily with lunch.   nitrofurantoin (macrocrystal-monohydrate) 100 MG capsule Commonly known as: Macrobid Take 1 capsule (100 mg total) by mouth at bedtime for 7 days.   omega-3 acid ethyl  esters 1 g capsule Commonly known as: LOVAZA Take 1 g by mouth daily.   omeprazole 20 MG capsule Commonly known as: PRILOSEC Take 20 mg by mouth daily before lunch.   OVER THE COUNTER MEDICATION Take 2 capsules by mouth daily. Bone Up   OVER THE COUNTER MEDICATION Take 1 tablet by mouth daily. ARTERIAL PROTECT   oxyCODONE 5 MG immediate release tablet Commonly known as: Roxicodone Take 1 tablet (5 mg total) by mouth every 8 (eight) hours as needed.   phenazopyridine 100 MG tablet Commonly known as: PYRIDIUM Take 1 tablet (100 mg total) by mouth 3 (three) times  daily with meals.   Red Yeast Rice 600 MG Caps Take 1,200 mg by mouth Nightly.   Vitamin D3 125 MCG (5000 UT) Caps Take 5,000 Units by mouth daily.   vitamin E 180 MG (400 UNITS) capsule Take 400 Units by mouth every other day.       Follow-up Information    Maizie Garno, Honor Loh, MD Follow up in 4 week(s).   Specialty: Obstetrics and Gynecology Why: January 01, 2020 @ 10:00 am [facility] information: [place] [address] [phone]               Signed:  [place] Attending [facility]

## 2020-01-28 IMAGING — CT CT HEAD W/O CM
3 series · 15 of 45 positions shown, 18 images · non-contrast
Comparison: Head CT 02/28/2009

CLINICAL DATA: Minor head trauma while on anti coagulation.  Fall.

EXAM:
CT HEAD WITHOUT CONTRAST
TECHNIQUE: Contiguous axial images were obtained from the base of the skull
through the vertex without intravenous contrast.

[Series 2: head wo · axial · 0.40mm/px · z∈[+411,+526]mm · 9 of 28 slices shown, 12 images]
[im 3/28  brain]
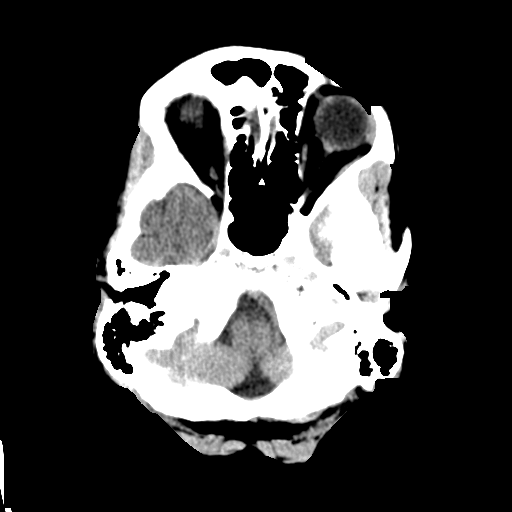
[im 3/28  bone]
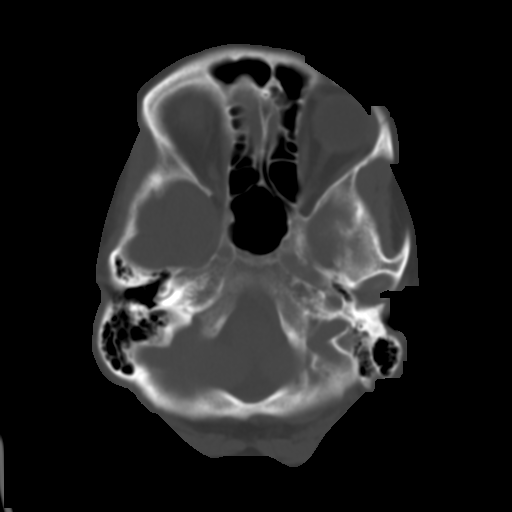
[im 6/28  brain]
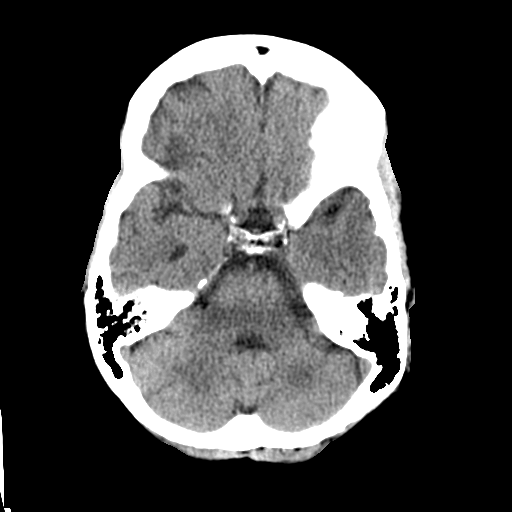
[im 9/28  brain]
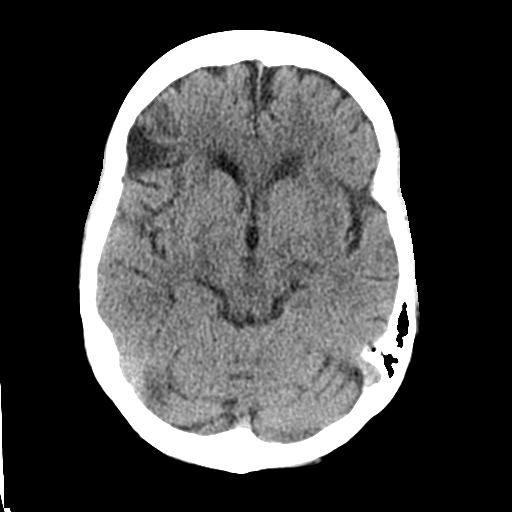
[im 12/28  brain]
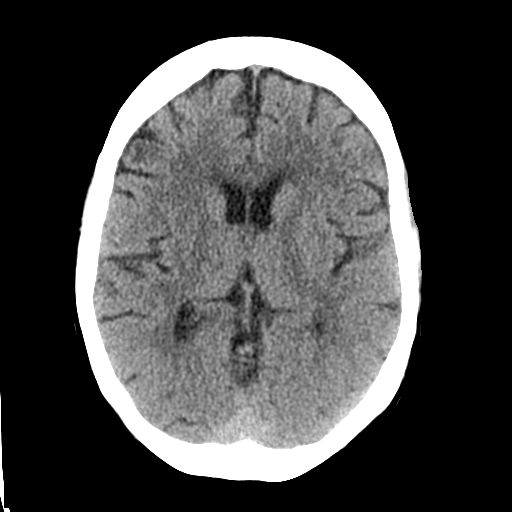
[im 15/28  brain]
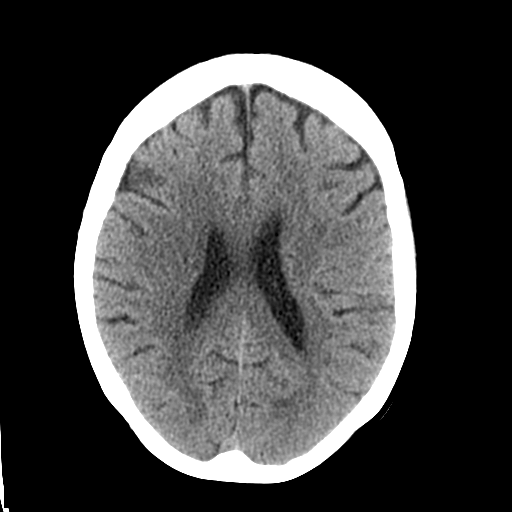
[im 15/28  bone]
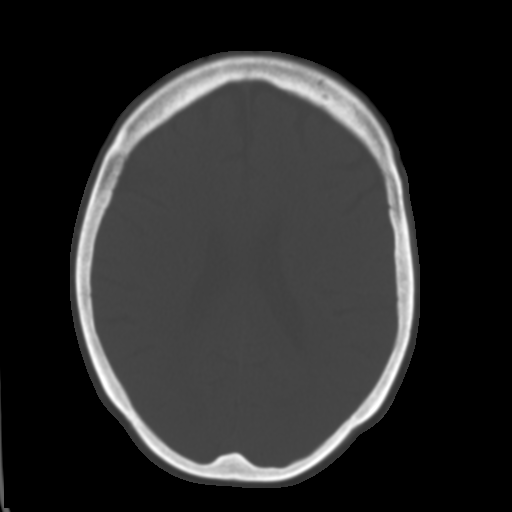
[im 17/28  brain]
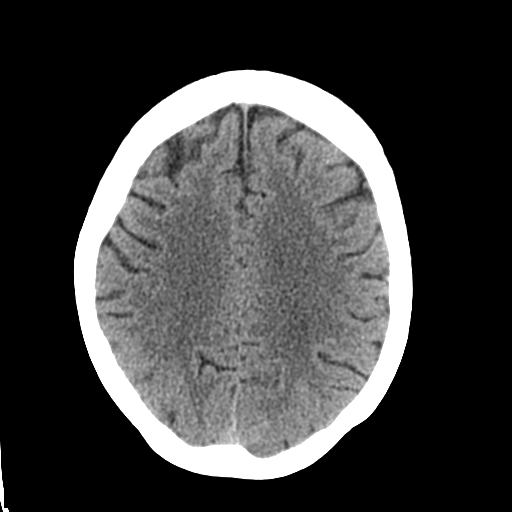
[im 20/28  brain]
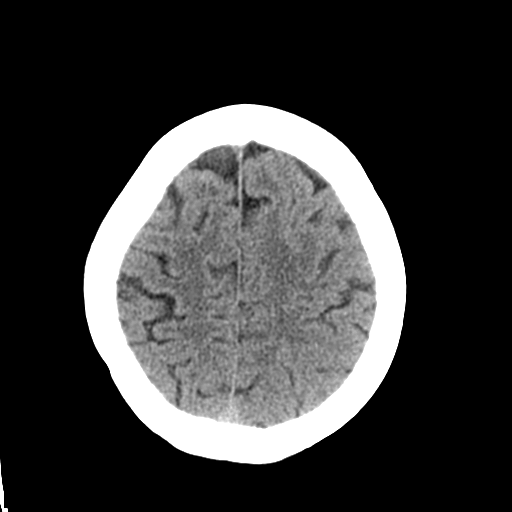
[im 23/28  brain]
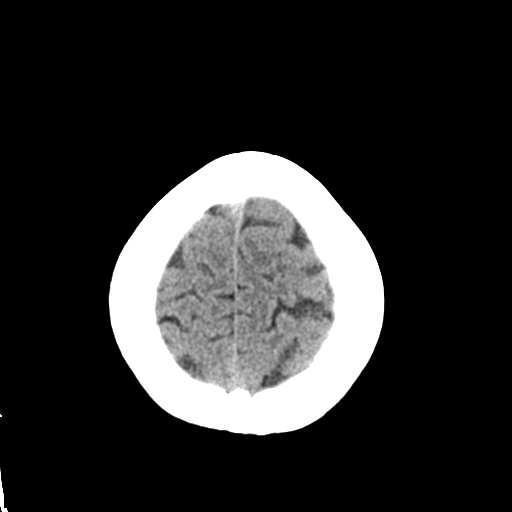
[im 26/28  brain]
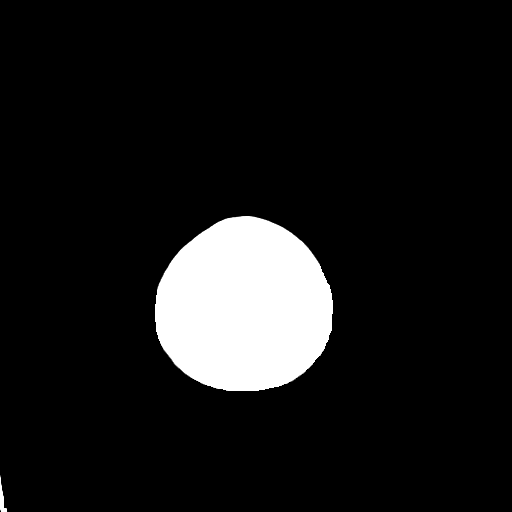
[im 26/28  bone]
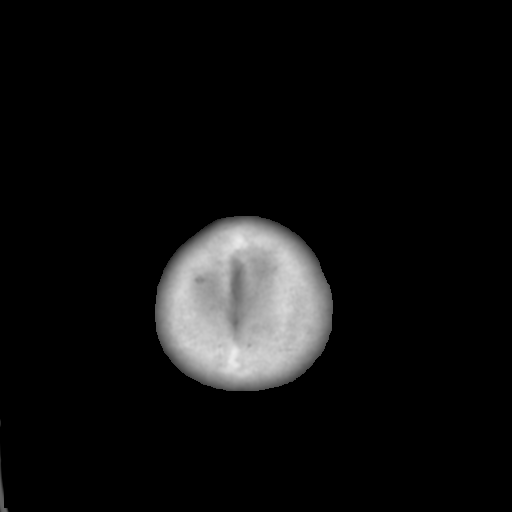

[Series 4: coronal soft tissue · coronal · 0.28mm/px · 3 of 60 slices shown]
[im 20/60  brain]
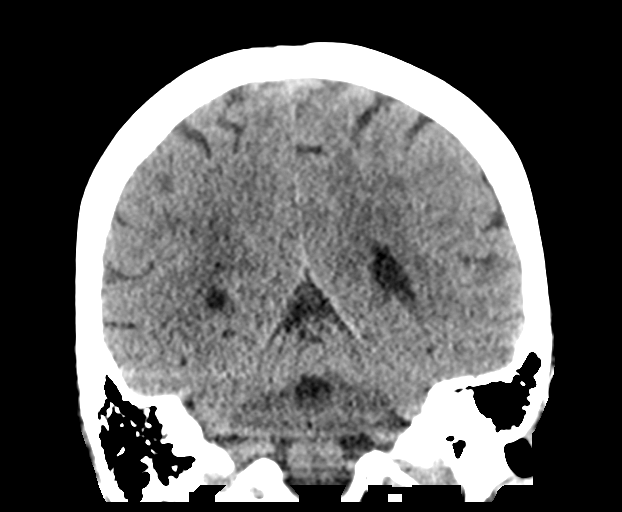
[im 27/60  brain]
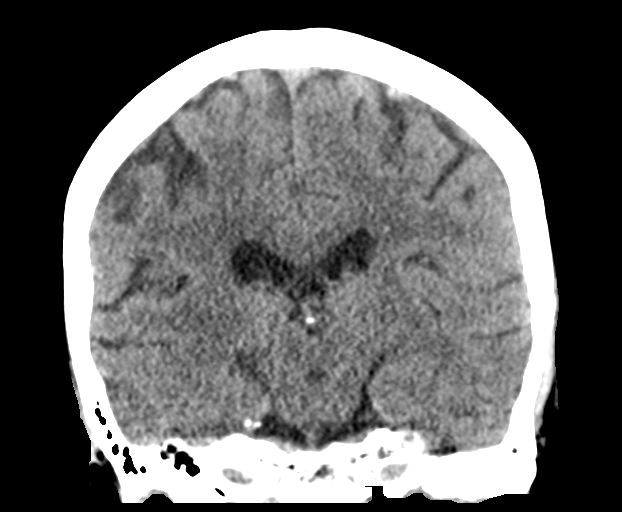
[im 33/60  brain]
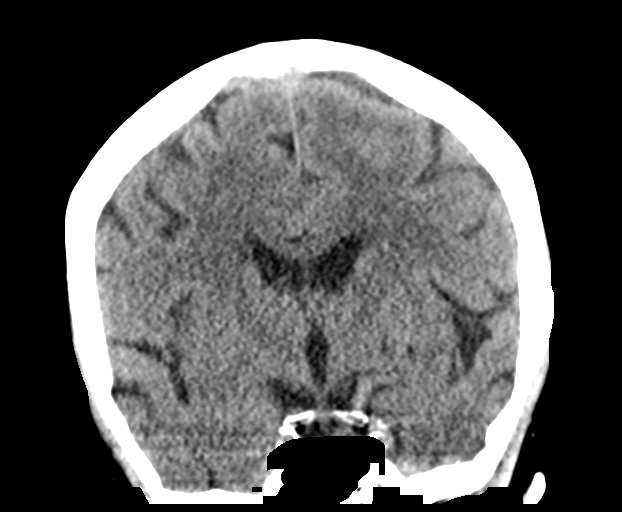

[Series 5: sagittal soft tissue · sagittal · 0.29mm/px · 3 of 49 slices shown]
[im 17/49  brain]
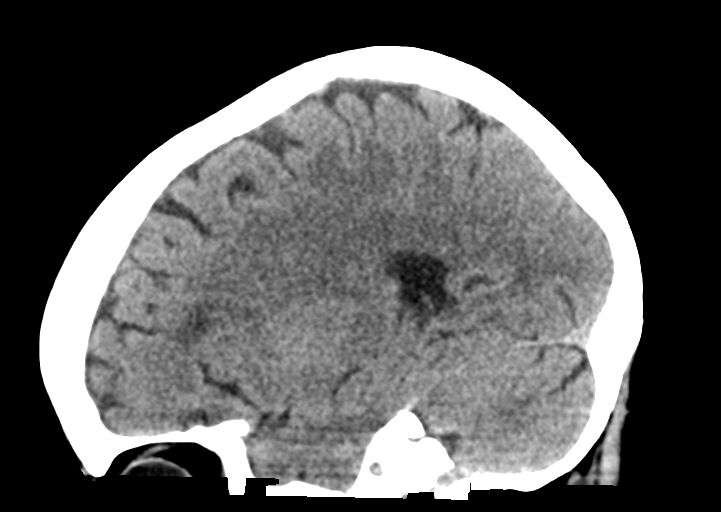
[im 25/49  brain]
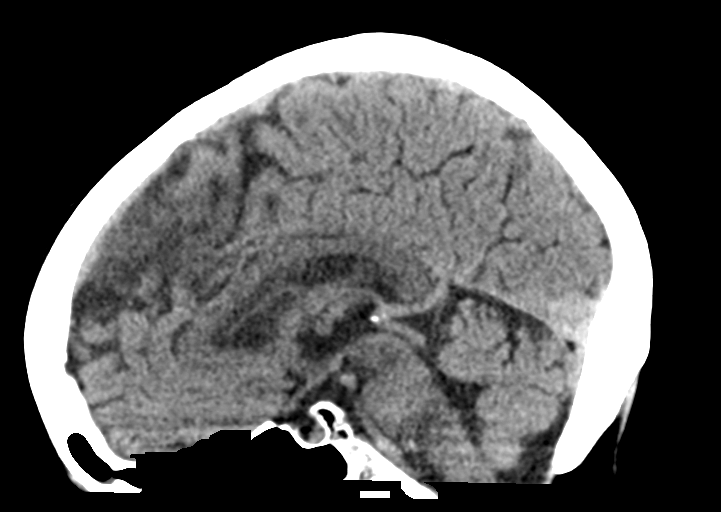
[im 33/49  brain]
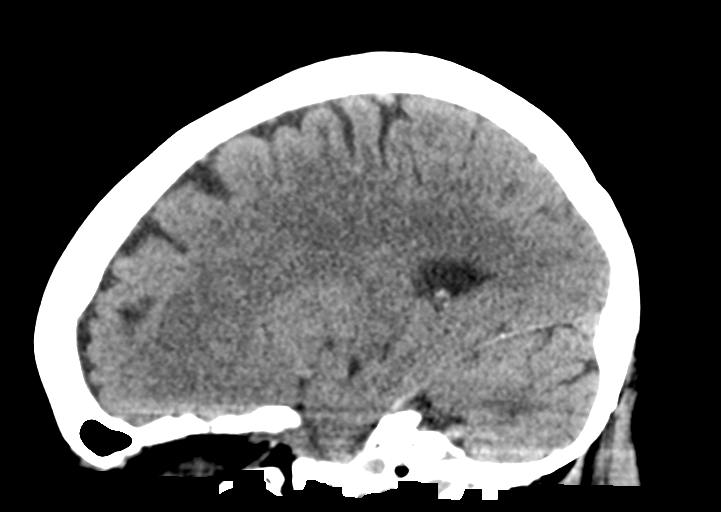

[15 of 45 positions shown; findings below may reference images not displayed]

FINDINGS: Brain: No mass lesion, intraparenchymal hemorrhage or extra-axial
collection. No evidence of acute cortical infarct. Old right frontal
lobe infarct is unchanged.

Vascular: No hyperdense vessel or unexpected vascular calcification.

Skull: Normal visualized skull base, calvarium and extracranial soft
tissues.

Sinuses/Orbits: No sinus fluid levels or advanced mucosal
thickening. No mastoid effusion. Normal orbits.
IMPRESSION: No acute abnormality. Old right frontal lobe infarct is unchanged
compared to 02/28/2009.

## 2020-04-29 ENCOUNTER — Ambulatory Visit (INDEPENDENT_AMBULATORY_CARE_PROVIDER_SITE_OTHER): Payer: Medicare Other

## 2020-04-29 ENCOUNTER — Ambulatory Visit (INDEPENDENT_AMBULATORY_CARE_PROVIDER_SITE_OTHER): Payer: Medicare Other | Admitting: Podiatry

## 2020-04-29 ENCOUNTER — Other Ambulatory Visit: Payer: Self-pay

## 2020-04-29 ENCOUNTER — Other Ambulatory Visit: Payer: Self-pay | Admitting: Podiatry

## 2020-04-29 ENCOUNTER — Encounter: Payer: Self-pay | Admitting: Podiatry

## 2020-04-29 DIAGNOSIS — M19079 Primary osteoarthritis, unspecified ankle and foot: Secondary | ICD-10-CM

## 2020-04-29 DIAGNOSIS — M19071 Primary osteoarthritis, right ankle and foot: Secondary | ICD-10-CM | POA: Diagnosis not present

## 2020-04-29 DIAGNOSIS — M779 Enthesopathy, unspecified: Secondary | ICD-10-CM

## 2020-04-29 NOTE — Progress Notes (Signed)
Subjective:  Patient ID: Cassandra Johns, female    DOB: 07-Feb-1941,  MRN: [id number]  Chief Complaint  Patient presents with  . Foot Pain    Patient presents today for right foot pain down 1st Metatarsal and top of foot x 1 week    80 y.o. female presents with the above complaint.  Patient presents with complaint of right dorsal midfoot pain.  Patient states been going on for about 1 week.  She has had some new shoes that she has been wearing them that are ankle top.  She states that it started right afterwards.  She is very painful to walk on it.  She has not tried any conservative treatment options she denies any other acute complaints.  She wanted to get it evaluated.   Review of Systems: Negative except as noted in the HPI. Denies N/V/F/Ch.  Past Medical History:  Diagnosis Date  . Chronic constipation   . Diverticulosis   . Esophagitis   . GERD (gastroesophageal reflux disease)   . History of colon polyps   . Hyperlipidemia   . Hypertension    h/o bp now controlled  . Hypothyroidism   . Stroke ([place])     TIA-2011  . UTI (urinary tract infection)     Current Outpatient Medications:  .  fluticasone (FLONASE) 50 MCG/ACT nasal spray, Place into the nose., Disp: , Rfl:  .  acidophilus (RISAQUAD) CAPS capsule, Take 1 capsule by mouth daily., Disp: , Rfl:  .  Alpha-Lipoic Acid 600 MG CAPS, Take 1,200 mg by mouth daily., Disp: , Rfl:  .  aspirin EC 81 MG tablet, Take 81 mg by mouth daily. Swallow whole., Disp: , Rfl:  .  b complex vitamins tablet, Take 1 tablet by mouth daily., Disp: , Rfl:  .  Cholecalciferol (VITAMIN D3) 125 MCG (5000 UT) CAPS, Take 5,000 Units by mouth daily., Disp: , Rfl:  .  clopidogrel (PLAVIX) 75 MG tablet, Take 75 mg by mouth daily., Disp: , Rfl:  .  Coenzyme Q10 400 MG CAPS, Take 800 mg by mouth daily. , Disp: , Rfl:  .  levothyroxine (SYNTHROID, LEVOTHROID) 75 MCG tablet, Take 75 mcg by mouth daily before breakfast., Disp: , Rfl:  .  MAGNESIUM PO, Take 1  tablet by mouth daily., Disp: , Rfl:  .  MYRBETRIQ 25 MG TB24 tablet, Take 25 mg by mouth daily with lunch. , Disp: , Rfl:  .  omega-3 acid ethyl esters (LOVAZA) 1 g capsule, Take 1 g by mouth daily. , Disp: , Rfl:  .  omeprazole (PRILOSEC) 20 MG capsule, Take 20 mg by mouth daily before lunch. , Disp: , Rfl:  .  OVER THE COUNTER MEDICATION, Take 2 capsules by mouth daily. Bone Up, Disp: , Rfl:  .  OVER THE COUNTER MEDICATION, Take 1 tablet by mouth daily. ARTERIAL PROTECT, Disp: , Rfl:  .  PREMARIN 0.45 MG tablet, Take 0.45 mg by mouth daily., Disp: , Rfl:  .  Red Yeast Rice 600 MG CAPS, Take 1,200 mg by mouth Nightly., Disp: , Rfl:  .  Turmeric (CURCUMIN 95) 500 MG CAPS, Take 1,000 mg by mouth daily., Disp: , Rfl:  .  vitamin E 400 UNIT capsule, Take 400 Units by mouth every other day. , Disp: , Rfl:   Social History   Tobacco Use  Smoking Status Never Smoker  Smokeless Tobacco Never Used    Allergies  Allergen Reactions  . Lactose Intolerance (Gi)    Objective:  There were no vitals filed for this visit. There is no height or weight on file to calculate BMI. Constitutional Well developed. Well nourished.  Vascular Dorsalis pedis pulses palpable bilaterally. Posterior tibial pulses palpable bilaterally. Capillary refill normal to all digits.  No cyanosis or clubbing noted. Pedal hair growth normal.  Neurologic Normal speech. Oriented to person, place, and time. Epicritic sensation to light touch grossly present bilaterally.  Dermatologic Nails well groomed and normal in appearance. No open wounds. No skin lesions.  Orthopedic:  Pain on palpation to the right dorsal midfoot.  No extensor or flexor tendinitis.  Pain on palpation to the second tarsometatarsal joint.  No pain at the bunion deformity.  No Lisfranc injury noted.   Radiographs: 3 views of skeletally mature the right foot: Mild osteoarthritic changes noted to the midfoot.  No other bony abnormalities identified.  No  fractures noted Assessment:   1. Arthritis of midfoot    Plan:  Patient was evaluated and treated and all questions answered.  Right dorsal midfoot arthritis -I explained the patient the etiology of arthritis and various treatment options were discussed.  Given the amount of pain she is having I believe patient will benefit from a steroid injection to help decrease acute inflammatory component associated pain.  Patient agrees with the plan would like to proceed with a steroid injection. -A steroid injection was performed at right dorsal midfoot using 1% plain Lidocaine and 10 mg of Kenalog. This was well tolerated.   No follow-ups on file.

## 2020-06-10 ENCOUNTER — Ambulatory Visit: Payer: Medicare Other | Admitting: Podiatry

## 2020-07-30 ENCOUNTER — Encounter: Payer: Self-pay | Admitting: Ophthalmology

## 2020-08-06 NOTE — Discharge Instructions (Signed)
INSTRUCTIONS FOLLOWING OCULOPLASTIC SURGERY Cassandra Dennie Maizes, MD  AFTER YOUR EYE SURGERY, THER ARE MANY THINGS WHICH YOU, THE PATIENT, CAN DO TO ASSURE THE BEST POSSIBLE RESULT FROM YOUR OPERATION.  THIS SHEET SHOULD BE REFERRED TO WHENEVER QUESTIONS ARISE.  IF THERE ARE ANY QUESTIONS NOT ANSWERED HERE, DO NOT HESITATE TO CALL OUR OFFICE AT [phone] OR [phone].  THERE IS ALWAYS SOMEONE AVAILABLE TO CALL IF QUESTIONS OR PROBLEMS ARISE.  VISION: Your vision may be blurred and out of focus after surgery until you are able to stop using your ointment, swelling resolves and your eye(s) heal. This may take 1 to 2 weeks at the least.  If your vision becomes gradually more dim or dark, this is not normal and you need to call our office immediately.  EYE CARE: For the first 48 hours after surgery, use ice packs frequently - "20 minutes on, 20 minutes off" - to help reduce swelling and bruising.  Small bags of frozen peas or corn make good ice packs along with cloths soaked in ice water.  If you are wearing a patch or other type of dressing following surgery, keep this on for the amount of time specified by your doctor.  For the first week following surgery, you will need to treat your stitches with great care.  It is OK to shower, but take care to not allow soapy water to run into your eye(s) to help reduce chances of infection.  You may gently clean the eyelashes and around the eye(s) with cotton balls and sterile water, BUT DO NOT RUB THE STITCHES VIGOROUSLY.  Keeping your stitches moist with ointment will help promote healing with minimal scar formation.  ACTIVITY: When you leave the surgery center, you should go home, rest and be inactive.  The eye(s) may feel scratchy and keeping the eyes closed will allow for faster healing.  The first week following surgery, avoid straining (anything making the face turn red) or lifting over 20 pounds.  Additionally, avoid bending which causes your head to go below  your waist.  Using your eyes will NOT harm them, so feel free to read, watch television, use the computer, etc as desired.  Driving depends on each individual, so check with your doctor if you have questions about driving. Do not wear contact lenses for about 2 weeks.  Do not wear eye makeup for 2 weeks.  Avoid swimming, hot tubs, gardening, and dusting for 1 to 2 weeks to reduce the risk of an infection.  MEDICATIONS:  You will be given a prescription for an ointment to use 4 times a day on your stitches.  You can use the ointment in your eyes if they feel scratchy or irritated.  If you eyelid(s) don't close completely when you sleep, put some ointment in your eyes before bedtime.  EMERGENCY: If you experience SEVERE EYE PAIN OR HEADACHE UNRELIEVED BY TYLENOL OR TRAMADOL, NAUSEA OR VOMITING, WORSENING REDNESS, OR WORSENING VISION (ESPECIALLY VISION THAT WAS INITIALLY BETTER) CALL [phone] OR [phone] DURING BUSINESS HOURS OR AFTER HOURS.

## 2020-08-08 ENCOUNTER — Ambulatory Visit
Admission: RE | Admit: 2020-08-08 | Discharge: 2020-08-08 | Disposition: A | Discharge status: Home / Self Care | Payer: Medicare Other | Attending: Ophthalmology | Admitting: Ophthalmology

## 2020-08-08 ENCOUNTER — Encounter
Admission: RE | Disposition: A | Discharge status: Home / Self Care | Payer: Self-pay | Source: Home / Self Care | Attending: Ophthalmology

## 2020-08-08 ENCOUNTER — Ambulatory Visit: Payer: Medicare Other | Admitting: Anesthesiology

## 2020-08-08 ENCOUNTER — Other Ambulatory Visit: Payer: Self-pay

## 2020-08-08 ENCOUNTER — Encounter: Payer: Self-pay | Admitting: Ophthalmology

## 2020-08-08 DIAGNOSIS — Z79899 Other long term (current) drug therapy: Secondary | ICD-10-CM | POA: Diagnosis not present

## 2020-08-08 DIAGNOSIS — L574 Cutis laxa senilis: Secondary | ICD-10-CM | POA: Insufficient documentation

## 2020-08-08 DIAGNOSIS — Z79890 Hormone replacement therapy (postmenopausal): Secondary | ICD-10-CM | POA: Diagnosis not present

## 2020-08-08 DIAGNOSIS — Z7902 Long term (current) use of antithrombotics/antiplatelets: Secondary | ICD-10-CM | POA: Insufficient documentation

## 2020-08-08 DIAGNOSIS — H57813 Brow ptosis, bilateral: Secondary | ICD-10-CM | POA: Diagnosis not present

## 2020-08-08 HISTORY — PX: BROW LIFT: SHX178

## 2020-08-08 SURGERY — BLEPHAROPLASTY
Anesthesia: Monitor Anesthesia Care | Site: Face | Laterality: Bilateral

## 2020-08-08 MED ORDER — ACETAMINOPHEN 160 MG/5ML PO SOLN: 325.0000 mg | Freq: Once | ORAL | Status: DC

## 2020-08-08 MED ORDER — ONDANSETRON HCL 4 MG/2ML IJ SOLN
INTRAMUSCULAR | Status: DC | PRN
  Administered 2020-08-08: 4 mg via INTRAVENOUS

## 2020-08-08 MED ORDER — DEXMEDETOMIDINE HCL 200 MCG/2ML IV SOLN
INTRAVENOUS | Status: DC | PRN
  Administered 2020-08-08: 5 ug via INTRAVENOUS

## 2020-08-08 MED ORDER — ERYTHROMYCIN 5 MG/GM OP OINT
TOPICAL_OINTMENT | OPHTHALMIC | Status: DC | PRN
  Administered 2020-08-08: 1 via OPHTHALMIC

## 2020-08-08 MED ORDER — MIDAZOLAM HCL 2 MG/2ML IJ SOLN
INTRAMUSCULAR | Status: DC | PRN
  Administered 2020-08-08: 2 mg via INTRAVENOUS

## 2020-08-08 MED ORDER — TETRACAINE HCL 0.5 % OP SOLN
OPHTHALMIC | Status: DC | PRN
  Administered 2020-08-08: 1 [drp] via OPHTHALMIC

## 2020-08-08 MED ORDER — ACETAMINOPHEN 325 MG PO TABS: 325.0000 mg | ORAL_TABLET | Freq: Once | ORAL | Status: DC

## 2020-08-08 MED ORDER — ALFENTANIL 500 MCG/ML IJ INJ
INJECTION | INTRAVENOUS | Status: DC | PRN
  Administered 2020-08-08: 500 ug via INTRAVENOUS

## 2020-08-08 MED ORDER — BSS IO SOLN
INTRAOCULAR | Status: DC | PRN
  Administered 2020-08-08: 15 mL

## 2020-08-08 MED ORDER — LIDOCAINE-EPINEPHRINE 2 %-1:100000 IJ SOLN
INTRAMUSCULAR | Status: DC | PRN
  Administered 2020-08-08: 4 mL via OPHTHALMIC

## 2020-08-08 MED ORDER — TRAMADOL HCL 50 MG PO TABS: ORAL_TABLET | ORAL | 0 refills | Status: DC

## 2020-08-08 MED ORDER — LACTATED RINGERS IV SOLN: INTRAVENOUS | Status: DC

## 2020-08-08 SURGICAL SUPPLY — 36 items
APPLICATOR COTTON TIP WD 3 STR (MISCELLANEOUS) ×3 IMPLANT
BLADE SURG 15 STRL LF DISP TIS (BLADE) ×1 IMPLANT
BLADE SURG 15 STRL SS (BLADE) ×3
CORD BIP STRL DISP 12FT (MISCELLANEOUS) ×3 IMPLANT
GAUZE SPONGE 4X4 12PLY STRL (GAUZE/BANDAGES/DRESSINGS) ×3 IMPLANT
GLOVE SURG LX 7.0 MICRO (GLOVE) ×4
GLOVE SURG LX STRL 7.0 MICRO (GLOVE) ×2 IMPLANT
GOWN STRL REUS W/ TWL LRG LVL3 (GOWN DISPOSABLE) ×1 IMPLANT
GOWN STRL REUS W/TWL LRG LVL3 (GOWN DISPOSABLE) ×3
MARKER SKIN XFINE TIP W/RULER (MISCELLANEOUS) ×3 IMPLANT
NEEDLE FILTER BLUNT 18X 1/2SAF (NEEDLE) ×2
NEEDLE FILTER BLUNT 18X1 1/2 (NEEDLE) ×1 IMPLANT
NEEDLE HYPO 30X.5 LL (NEEDLE) ×6 IMPLANT
PACK ENT CUSTOM (PACKS) ×3 IMPLANT
SOL PREP PVP 2OZ (MISCELLANEOUS) ×3
SOLUTION PREP PVP 2OZ (MISCELLANEOUS) ×1 IMPLANT
SPONGE GAUZE 2X2 8PLY STER LF (GAUZE/BANDAGES/DRESSINGS) ×10
SPONGE GAUZE 2X2 8PLY STRL LF (GAUZE/BANDAGES/DRESSINGS) ×20 IMPLANT
SUT CHROMIC 4-0 (SUTURE)
SUT CHROMIC 4-0 M2 12X2 ARM (SUTURE)
SUT CHROMIC 5 0 P 3 (SUTURE) ×3 IMPLANT
SUT ETHILON 4 0 CL P 3 (SUTURE) IMPLANT
SUT GUT PLAIN 6-0 1X18 ABS (SUTURE) ×3 IMPLANT
SUT MERSILENE 4-0 S-2 (SUTURE) IMPLANT
SUT PROLENE 5 0 P 3 (SUTURE) ×3 IMPLANT
SUT PROLENE 6 0 P 1 18 (SUTURE) IMPLANT
SUT SILK 4 0 G 3 (SUTURE) IMPLANT
SUT VIC AB 5-0 P-3 18X BRD (SUTURE) IMPLANT
SUT VIC AB 5-0 P3 18 (SUTURE)
SUT VICRYL 6-0  S14 CTD (SUTURE)
SUT VICRYL 6-0 S14 CTD (SUTURE) IMPLANT
SUT VICRYL 7 0 TG140 8 (SUTURE) IMPLANT
SUTURE CHRMC 4-0 M2 12X2 ARM (SUTURE) IMPLANT
SYR 10ML LL (SYRINGE) ×3 IMPLANT
SYR 3ML LL SCALE MARK (SYRINGE) ×3 IMPLANT
WATER STERILE IRR 250ML POUR (IV SOLUTION) ×3 IMPLANT

## 2020-08-08 NOTE — Op Note (Signed)
Preoperative Diagnosis:   Visually significant bilateral brow ptosis.   Postoperative Diagnosis: Same.   Procedure(s) Performed:   bilateral  Direct brow lift to improve vision.   Surgeon: Philis Pique. Vickki Muff, M.D.   Assistants: None   Anesthesia: MAC  Specimens: None.  Estimated Blood Loss: Minimal.  Complications: None.  Operative Findings: None Dictated  PROCEDURE:   Allergies were reviewed and the patient is allergic to lactose intolerance (gi)..   After the risks, benefits, complications and alternatives were discussed with the patient, appropriate informed consent was obtained. While seated in an upright position and looking in primary gaze, the amount of supra-brow skin to be removed was measured and marked in an elliptical pattern. The patient was then brought to the operating suite and reclined supine.  Timeout was conducted and the patient was sedated. Local anesthetic consisting of a 50-50 mixture of 2% lidocaine with epinephrine and 0.75% bupivacaine with added Hylenex was injected subcutaneously to the bilateral  brow region(s) and down to the periosteum. After adequate local was instilled, the patient was prepped and draped in the usual sterile fashion for eyelid surgery.   Attention was turned to the right brow region. A #15 blade was used to create a bevelled incision along the premarked incision line. A skin and subcutaneous tissue flap was then excised and hemostasis was obtained with bipolar cautery. The deep tissues were reapproximated with interrupted vertical 5-0 chromic sutures. The skin margin was reapproximated with a running locking 5-0 Prolene suture. Attention was then turned to the opposite brow region where the same procedure was performed in the same manner.    The patient tolerated the procedure well. Erythromycin ophthalmic ointment was applied to the incision site(s) followed by ice packs.The patient was taken to the recovery area where she recovered  without difficulty.  Post-Op Plan/Instructions:  The patient was instructed to use ice packs frequently for the next 48 hours.  she was instructed to use over-the-counter antibiotic ointment on the brow sutures 4 times a day for the next 12 to 14 days. she was given a prescription for Tramadol (or similar) for pain control should Tylenol not be effective. she was asked to to follow up in 10-12 days time for suture removal or sooner as needed for problems.   Danali Marinos M. Vickki Muff, M.D. Ophthalmology

## 2020-08-08 NOTE — Anesthesia Postprocedure Evaluation (Signed)
Anesthesia Post Note  Patient: Cassandra Johns  Procedure(s) Performed: Delphina Cahill PTOSIS REPAIR BILATERAL (Bilateral: Face)     Patient location during evaluation: PACU Anesthesia Type: MAC Level of consciousness: awake and alert and oriented Pain management: satisfactory to patient Vital Signs Assessment: post-procedure vital signs reviewed and stable Respiratory status: spontaneous breathing, nonlabored ventilation and respiratory function stable Cardiovascular status: blood pressure returned to baseline and stable Postop Assessment: Adequate PO intake and No signs of nausea or vomiting Anesthetic complications: no   No notable events documented.  Raliegh Ip

## 2020-08-08 NOTE — Transfer of Care (Signed)
Immediate Anesthesia Transfer of Care Note  Patient: Cassandra Johns  Procedure(s) Performed: Delphina Cahill PTOSIS REPAIR BILATERAL (Bilateral: Face)  Patient Location: PACU  Anesthesia Type: MAC  Level of Consciousness: awake, alert  and patient cooperative  Airway and Oxygen Therapy: Patient Spontanous Breathing and Patient connected to supplemental oxygen  Post-op Assessment: Post-op Vital signs reviewed, Patient's Cardiovascular Status Stable, Respiratory Function Stable, Patent Airway and No signs of Nausea or vomiting  Post-op Vital Signs: Reviewed and stable  Complications: No notable events documented.

## 2020-08-08 NOTE — Interval H&P Note (Signed)
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  The various methods of treatment have been discussed with the patient and family. After consideration of risks, benefits and other options for treatment, the patient has consented to  Procedure(s): [place] (Bilateral) as a surgical intervention.  The patient's history has been reviewed, patient examined, no change in status, stable for surgery.  I have reviewed the patient's chart and labs.  Questions were answered to the patient's satisfaction.     Vickki Muff, Menashe Kafer M

## 2020-08-08 NOTE — Anesthesia Procedure Notes (Signed)
Procedure Name: MAC Date/Time: 08/08/2020 7:59 AM Performed by: Jeannene Patella, CRNA Pre-anesthesia Checklist: Patient identified, Emergency Drugs available, Suction available, Patient being monitored and Timeout performed Patient Re-evaluated:Patient Re-evaluated prior to induction Oxygen Delivery Method: Nasal cannula

## 2020-08-08 NOTE — Anesthesia Preprocedure Evaluation (Signed)
Anesthesia Evaluation  Patient identified by MRN, date of birth, ID band Patient awake    Reviewed: Allergy & Precautions, H&P , NPO status , Patient's Chart, lab work & pertinent test results  Airway Mallampati: II  TM Distance: >3 FB Neck ROM: full    Dental no notable dental hx.    Pulmonary    Pulmonary exam normal breath sounds clear to auscultation       Cardiovascular Normal cardiovascular exam Rhythm:regular Rate:Normal     Neuro/Psych    GI/Hepatic GERD  ,  Endo/Other  Hypothyroidism   Renal/GU      Musculoskeletal   Abdominal   Peds  Hematology   Anesthesia Other Findings   Reproductive/Obstetrics                             Anesthesia Physical Anesthesia Plan  ASA: 2  Anesthesia Plan: MAC   Post-op Pain Management:    Induction:   PONV Risk Score and Plan: 2 and Treatment may vary due to age or medical condition, Ondansetron and TIVA  Airway Management Planned:   Additional Equipment:   Intra-op Plan:   Post-operative Plan:   Informed Consent: I have reviewed the patients History and Physical, chart, labs and discussed the procedure including the risks, benefits and alternatives for the proposed anesthesia with the patient or authorized representative who has indicated his/her understanding and acceptance.     Dental Advisory Given  Plan Discussed with: CRNA  Anesthesia Plan Comments:         Anesthesia Quick Evaluation

## 2020-08-08 NOTE — H&P (Signed)
[place]: [facility]  Primary Care Physician:  Derinda Late, MD Ophthalmologist: Dr. Philis Pique. Vickki Muff, M.D.  Pre-Procedure History & Physical: HPI:  Cassandra Johns is a 80 y.o. female here for periocular surgery.   Past Medical History:  Diagnosis Date   Chronic constipation    Diverticulosis    Esophagitis    GERD (gastroesophageal reflux disease)    History of colon polyps    Hyperlipidemia    Hypertension    h/o bp now controlled   Hypothyroidism    Stroke [facility])     TIA-2011   UTI (urinary tract infection)     Past Surgical History:  Procedure Laterality Date   ABDOMINAL HYSTERECTOMY     WITH BSO   BACK SURGERY     CERVICAL LAMINECTOMY   CERVICAL LAMINECTOMY     X 2   COLONOSCOPY     COLONOSCOPY WITH PROPOFOL N/A 02/28/2017   Procedure: COLONOSCOPY WITH PROPOFOL;  Surgeon: Lollie Sails, MD;  Location: [facility] ENDOSCOPY;  Service: Endoscopy;  Laterality: N/A;   CYSTOCELE REPAIR N/A 12/14/2019   Procedure: ANTERIOR REPAIR (CYSTOCELE);  Surgeon: Ward, Honor Loh, MD;  Location: [facility] ORS;  Service: Gynecology;  Laterality: N/A;   ESOPHAGOGASTRODUODENOSCOPY      Prior to Admission medications   Medication Sig Start Date End Date Taking? Authorizing Provider  acidophilus (RISAQUAD) CAPS capsule Take 1 capsule by mouth daily.   Yes [provider]  aspirin EC 81 MG tablet Take 81 mg by mouth daily. Swallow whole.   Yes [provider]  b complex vitamins tablet Take 1 tablet by mouth daily.   Yes [provider]  Cholecalciferol (VITAMIN D3) 125 MCG (5000 UT) CAPS Take 5,000 Units by mouth daily.   Yes [provider]  clopidogrel (PLAVIX) 75 MG tablet Take 75 mg by mouth daily.   Yes [provider]  Coenzyme Q10 400 MG CAPS Take 800 mg by mouth daily.    Yes [provider]  fluticasone (FLONASE) 50 MCG/ACT nasal spray Place into the nose. 02/21/20  Yes [provider]  levothyroxine (SYNTHROID,  LEVOTHROID) 75 MCG tablet Take 75 mcg by mouth daily before breakfast.   Yes [provider]  MAGNESIUM PO Take 1 tablet by mouth daily.   Yes [provider]  MYRBETRIQ 25 MG TB24 tablet Take 25 mg by mouth daily with lunch.  10/31/19  Yes [provider]  omega-3 acid ethyl esters (LOVAZA) 1 g capsule Take 1 g by mouth daily.    Yes [provider]  omeprazole (PRILOSEC) 20 MG capsule Take 20 mg by mouth daily before lunch.    Yes [provider]  OVER THE COUNTER MEDICATION Take 2 capsules by mouth daily. Bone Up   Yes [provider]  OVER THE COUNTER MEDICATION Take 1 tablet by mouth daily. ARTERIAL PROTECT   Yes [provider]  PREMARIN 0.45 MG tablet Take 0.45 mg by mouth daily. 03/18/20  Yes [provider]  Red Yeast Rice 600 MG CAPS Take 1,200 mg by mouth Nightly.   Yes [provider]  RESVERATROL PO Take by mouth daily.   Yes [provider]  Turmeric 500 MG CAPS Take 1,000 mg by mouth daily.   Yes [provider]  vitamin E 400 UNIT capsule Take 400 Units by mouth every other day.    Yes [provider]    Allergies as of 05/22/2020 - Review Complete 04/29/2020  Allergen  Reaction Noted   Lactose intolerance (gi)  12/06/2019    Family History  Problem Relation Age of Onset   Breast cancer Maternal Aunt        40's   Breast cancer Paternal Grandmother     Social History   Socioeconomic History   Marital status: Married    Spouse name: Not on file   Number of children: Not on file   Years of education: Not on file   Highest education level: Not on file  Occupational History   Not on file  Tobacco Use   Smoking status: Never   Smokeless tobacco: Never  Vaping Use   Vaping Use: Never used  Substance and Sexual Activity   Alcohol use: Yes    Comment: occ   Drug use: No   Sexual activity: Not on file  Other Topics Concern   Not on file  Social History  Narrative   Not on file   Social Determinants of Health   Financial Resource Strain: Not on file  Food Insecurity: Not on file  Transportation Needs: Not on file  Physical Activity: Not on file  Stress: Not on file  Social Connections: Not on file  Intimate Partner Violence: Not on file    Review of Systems: See HPI, otherwise negative ROS  Physical Exam: BP 122/71   Pulse 72   Temp (!) 97 F (36.1 C) (Temporal)   Ht 5\' 3"  (1.6 m)   Wt 56.7 kg   SpO2 98%   BMI 22.14 kg/m  General:   Alert and cooperative in NAD Head:  Normocephalic and atraumatic. Respiratory:  Normal work of breathing.  Impression/Plan: Cassandra Johns is here for periocular surgery.  Risks, benefits, limitations, and alternatives regarding surgery have been reviewed with the patient.  Questions have been answered.  All parties agreeable.   Karle Starch, MD  08/08/2020, 7:41 AM

## 2021-09-24 NOTE — Progress Notes (Signed)
[place]  [phone] [phone]  ID: Cassandra Johns OB: [birth year]  MR#: [id number]  OZD#:[id number]  Patient Care Team: Derinda Late, MD as PCP - General (Family Medicine)  REASON FOR REFERRAL: thrombocytosis  HPI: Cassandra Johns is a 81 y.o. female  Patient reports multiple GI symptoms.  She has burning sensation in epigastric region and sore throat for 4 to 5 weeks.  She was started on famotidine recently and has improvement in her symptoms.  She also reported feeling of incomplete emptying of bowels that has been worse recently.  She has chronic issues with constipation and has been on MiraLAX.  Her last colonoscopy was in 2019 which showed multiple tubular adenoma.  She reports she has been working with her primary for further evaluation with GI doctor.  Patient denied fever, unexplained weight loss, shortness of breath, chest pain, lumps, change in appetite, headache, vision changes, pruritis.  Denies any history of blood clots.  Reports episode of TIA in 2011.  Reports history of Raynaud's phenomenon 2-3 times a year.  Denies any rheumatologic related symptoms such as arthritis, dry eyes or mouth, rash, photosensitivity, oral ulcers, dysphagia, digital ulcers. Mother has history of scleroderma.  Denies family hx of thrombocytosis.   CBC with diff reviewed. Patient has elevated platelet count dating back to Aug 2019 and has been slowly trending up with most recent of 647 in July 2023. WBC and HB is normal.   REVIEW OF SYSTEMS:   ROS  As per HPI. Otherwise, a complete review of systems is negative.  PAST MEDICAL HISTORY: Past Medical History:  Diagnosis Date   Chronic constipation    Diverticulosis    Esophagitis    GERD (gastroesophageal reflux disease)    History of colon polyps    Hyperlipidemia    Hypertension    h/o bp now controlled   Hypothyroidism    Stroke ([place])     TIA-2011   UTI (urinary tract infection)     PAST  SURGICAL HISTORY: Past Surgical History:  Procedure Laterality Date   ABDOMINAL HYSTERECTOMY     WITH BSO   BACK SURGERY     CERVICAL LAMINECTOMY   BROW LIFT Bilateral 08/08/2020   Procedure: BROW PTOSIS REPAIR BILATERAL;  Surgeon: Karle Starch, MD;  Location: [facility];  Service: Ophthalmology;  Laterality: Bilateral;   CERVICAL LAMINECTOMY     X 2   COLONOSCOPY     COLONOSCOPY WITH PROPOFOL N/A 02/28/2017   Procedure: COLONOSCOPY WITH PROPOFOL;  Surgeon: Lollie Sails, MD;  Location: [facility] ENDOSCOPY;  Service: Endoscopy;  Laterality: N/A;   CYSTOCELE REPAIR N/A 12/14/2019   Procedure: ANTERIOR REPAIR (CYSTOCELE);  Surgeon: Ward, Honor Loh, MD;  Location: [facility] ORS;  Service: Gynecology;  Laterality: N/A;   ESOPHAGOGASTRODUODENOSCOPY      FAMILY HISTORY: Family History  Problem Relation Age of Onset   Heart attack Father    Brain cancer Sister    Heart attack Brother    Breast cancer Paternal Grandmother    Breast cancer Maternal Aunt        40's    ADVANCED DIRECTIVES (Y/N):  N  HEALTH MAINTENANCE: Social History   Tobacco Use   Smoking status: Never   Smokeless tobacco: Never  Vaping Use   Vaping Use: Never used  Substance Use Topics   Alcohol use: Yes    Comment: occ   Drug use: No    Allergies  Allergen Reactions   Lactose Intolerance (Gi)     (  Negative by test 2022)    Current Outpatient Medications  Medication Sig Dispense Refill   acidophilus (RISAQUAD) CAPS capsule Take 1 capsule by mouth daily.     b complex vitamins tablet Take 1 tablet by mouth daily.     Cholecalciferol (VITAMIN D3) 125 MCG (5000 UT) CAPS Take 5,000 Units by mouth daily.     clopidogrel (PLAVIX) 75 MG tablet Take 75 mg by mouth daily.     Coenzyme Q10 400 MG CAPS Take 800 mg by mouth daily.      famotidine (PEPCID) 20 MG tablet Take 20 mg by mouth daily.     levothyroxine (SYNTHROID, LEVOTHROID) 75 MCG tablet Take 75 mcg by mouth daily before breakfast.      MAGNESIUM PO Take 1 tablet by mouth daily.     omega-3 acid ethyl esters (LOVAZA) 1 g capsule Take 1 g by mouth daily.      omeprazole (PRILOSEC) 20 MG capsule Take 20 mg by mouth daily before lunch.      OVER THE COUNTER MEDICATION Take 2 capsules by mouth daily. Bone Up     OVER THE COUNTER MEDICATION Take 1 tablet by mouth daily. ARTERIAL PROTECT     PREMARIN 0.45 MG tablet Take 0.45 mg by mouth daily.     Red Yeast Rice 600 MG CAPS Take 1,200 mg by mouth Nightly.     RESVERATROL PO Take by mouth daily.     Turmeric 500 MG CAPS Take 1,000 mg by mouth daily.     vitamin E 400 UNIT capsule Take 400 Units by mouth every other day.      No current facility-administered medications for this visit.    OBJECTIVE: Vitals:   09/25/21 1114  BP: (!) 141/72  Pulse: 69  Resp: 18  Temp: 97.9 F (36.6 C)     Body mass index is 21.95 kg/m.      General: Well-developed, well-nourished, no acute distress. Eyes: Pink conjunctiva, anicteric sclera. HEENT: Normocephalic, moist mucous membranes, clear oropharnyx. Lungs: Clear to auscultation bilaterally. Heart: Regular rate and rhythm. No rubs, murmurs, or gallops. Abdomen: Soft, nontender, nondistended. No organomegaly noted, normoactive bowel sounds. Musculoskeletal: No edema, cyanosis, or clubbing. Neuro: Alert, answering all questions appropriately. Cranial nerves grossly intact. Skin: No rashes or petechiae noted. Psych: Normal affect. Lymphatics: No cervical, calvicular, axillary or inguinal LAD.   LAB RESULTS:  Lab Results  Component Value Date   NA 138 12/12/2019   K 3.8 12/12/2019   CL 100 12/12/2019   CO2 28 12/12/2019   GLUCOSE 77 12/12/2019   BUN 15 12/12/2019   CREATININE 1.00 12/12/2019   CALCIUM 9.4 12/12/2019   GFRNONAA 54 (L) 12/12/2019    Lab Results  Component Value Date   WBC 5.4 09/25/2021   NEUTROABS 3.2 09/25/2021   HGB 13.2 09/25/2021   HCT 40.2 09/25/2021   MCV 98.5 09/25/2021   PLT 636 (H) 09/25/2021     09/08/2021  No results found for: "TIBC", "FERRITIN", "IRONPCTSAT"   STUDIES: No results found.  ASSESSMENT AND PLAN:   Cassandra Johns is a 81 y.o. 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  Thrombocytosis, chronic - CBC with diff reviewed. Patient has elevated platelet count dating back to Aug 2019 and has been slowly trending up with most recent of 647 in July 2023. WBC and HB is normal. - patient is asymptomatic -Labs ordered as below.  Orders  Placed This Encounter  Procedures   Iron and TIBC([facility])   Ferritin   JAK2 V617F rfx CALR/MPL/E12-15   Sedimentation rate   C-reactive protein   CBC with Differential   2. GERD -Patient is following with primary physician who will be coordinating GI referral.  Recently started on famotidine with improvement. -Last colonoscopy was in 2019 which showed multiple tubular adenomas.  Patient expressed understanding and was in agreement with this plan. She also understands that She can call clinic at any time with any questions, concerns, or complaints.   I spent a total of 40 minutes reviewing chart data, face-to-face evaluation with the patient, counseling and coordination of care as detailed above.  [name], MD   09/25/2021 12:48 PM

## 2021-09-25 ENCOUNTER — Encounter: Payer: Self-pay | Admitting: Internal Medicine

## 2021-09-25 ENCOUNTER — Inpatient Hospital Stay: Payer: Medicare Other | Attending: Internal Medicine | Admitting: Internal Medicine

## 2021-09-25 ENCOUNTER — Inpatient Hospital Stay: Payer: Medicare Other

## 2021-09-25 VITALS — BP 141/72 | HR 69 | Temp 97.9°F | Resp 18 | Wt 123.9 lb

## 2021-09-25 DIAGNOSIS — Z8673 Personal history of transient ischemic attack (TIA), and cerebral infarction without residual deficits: Secondary | ICD-10-CM | POA: Diagnosis not present

## 2021-09-25 DIAGNOSIS — D75839 Thrombocytosis, unspecified: Secondary | ICD-10-CM

## 2021-09-25 DIAGNOSIS — R718 Other abnormality of red blood cells: Secondary | ICD-10-CM

## 2021-09-25 DIAGNOSIS — E039 Hypothyroidism, unspecified: Secondary | ICD-10-CM | POA: Diagnosis not present

## 2021-09-25 DIAGNOSIS — I10 Essential (primary) hypertension: Secondary | ICD-10-CM | POA: Diagnosis not present

## 2021-09-25 DIAGNOSIS — Z7902 Long term (current) use of antithrombotics/antiplatelets: Secondary | ICD-10-CM | POA: Insufficient documentation

## 2021-09-25 DIAGNOSIS — Z79899 Other long term (current) drug therapy: Secondary | ICD-10-CM | POA: Insufficient documentation

## 2021-09-25 DIAGNOSIS — D473 Essential (hemorrhagic) thrombocythemia: Secondary | ICD-10-CM | POA: Insufficient documentation

## 2021-09-25 DIAGNOSIS — D75838 Other thrombocytosis: Secondary | ICD-10-CM | POA: Diagnosis not present

## 2021-09-25 DIAGNOSIS — I7300 Raynaud's syndrome without gangrene: Secondary | ICD-10-CM | POA: Insufficient documentation

## 2021-09-25 DIAGNOSIS — Z79890 Hormone replacement therapy (postmenopausal): Secondary | ICD-10-CM | POA: Insufficient documentation

## 2021-09-25 LAB — CBC WITH DIFFERENTIAL/PLATELET
Abs Immature Granulocytes: 0.04 10*3/uL (ref 0.00–0.07)
Basophils Absolute: 0 10*3/uL (ref 0.0–0.1)
Basophils Relative: 1 %
Eosinophils Absolute: 0.2 10*3/uL (ref 0.0–0.5)
Eosinophils Relative: 3 %
HCT: 40.2 % (ref 36.0–46.0)
Hemoglobin: 13.2 g/dL (ref 12.0–15.0)
Immature Granulocytes: 1 %
Lymphocytes Relative: 27 %
Lymphs Abs: 1.5 10*3/uL (ref 0.7–4.0)
MCH: 32.4 pg (ref 26.0–34.0)
MCHC: 32.8 g/dL (ref 30.0–36.0)
MCV: 98.5 fL (ref 80.0–100.0)
Monocytes Absolute: 0.5 10*3/uL (ref 0.1–1.0)
Monocytes Relative: 9 %
Neutro Abs: 3.2 10*3/uL (ref 1.7–7.7)
Neutrophils Relative %: 59 %
Platelets: 636 10*3/uL — ABNORMAL HIGH (ref 150–400)
RBC: 4.08 MIL/uL (ref 3.87–5.11)
RDW: 13.5 % (ref 11.5–15.5)
WBC: 5.4 10*3/uL (ref 4.0–10.5)
nRBC: 0 % (ref 0.0–0.2)

## 2021-09-25 LAB — FERRITIN: Ferritin: 12 ng/mL (ref 11–307)

## 2021-09-25 LAB — C-REACTIVE PROTEIN: CRP: <0.5 mg/dL (ref <1.0)

## 2021-09-25 LAB — IRON AND TIBC
Iron: 92 ug/dL (ref 28–170)
Saturation Ratios: 23 % (ref 10.4–31.8)
TIBC: 403 ug/dL (ref 250–450)
UIBC: 311 ug/dL

## 2021-09-25 LAB — SEDIMENTATION RATE: Sed Rate: 9 mm/hr (ref 0–30)

## 2021-09-25 NOTE — Progress Notes (Signed)
Patient here today for initial evaluation regarding thrombocytosis.

## 2021-10-02 LAB — JAK2 V617F RFX CALR/MPL/E12-15: JAK2 V617F %: 5.03 %

## 2021-10-13 ENCOUNTER — Other Ambulatory Visit: Payer: Self-pay

## 2021-10-13 ENCOUNTER — Inpatient Hospital Stay (HOSPITAL_BASED_OUTPATIENT_CLINIC_OR_DEPARTMENT_OTHER): Payer: Medicare Other | Admitting: Internal Medicine

## 2021-10-13 ENCOUNTER — Inpatient Hospital Stay: Payer: Medicare Other

## 2021-10-13 VITALS — BP 135/84 | HR 81 | Temp 98.2°F | Resp 18 | Wt 120.1 lb

## 2021-10-13 DIAGNOSIS — D75839 Thrombocytosis, unspecified: Secondary | ICD-10-CM

## 2021-10-13 DIAGNOSIS — D75838 Other thrombocytosis: Secondary | ICD-10-CM | POA: Diagnosis not present

## 2021-10-13 LAB — LACTATE DEHYDROGENASE: LDH: 119 U/L (ref 98–192)

## 2021-10-13 NOTE — Progress Notes (Signed)
[place] OFFICE PROGRESS NOTE  Cassandra Late, MD  ASSESSMENT & PLAN:  Cassandra Johns is a 81 yo F with pmh of GERD and TIA was referred to hematology clinic for further work-up of thrombocytosis.  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 WBC and HB is normal. - Iron panel normal.  ESR CRP normal. - WHO criteria for ET diagnosis was reviewed.  We will perform IR guided bone marrow biopsy to better assess bone marrow morphology.  We will also send for NGS panel.  It will not affect our decision to treat ET, however presence of certain clonal markers can predict the risk of transformation to myelofibrosis.  - BCR/ABL to rule out CML  - LDH  I discussed in detail with the patient about the probable diagnosis of ET, need for bone marrow biopsy to diagnosis and overall prognosis.  Discussed about risk of transformation to acute leukemia and myelofibrosis in the future.  There are no medications available at this time that can reduce the risk of this transformation.  As per the IPSET thrombosis risk, she falls into high risk category due to her age and JAK2 positive.  We discussed in detail about the role of hydroxyurea as a cytoreductive therapy.  Side effects such as anemia, neutropenia, oral ulcers, digital ulcers, rising LFTs were discussed.  There is also a risk of increased secondary malignancies such as skin cancer.  However we will hold off on starting hydroxyurea until further work-up is completed.  Patient follows with dermatology on an annual basis for skin exams.  She will also need antiplatelet and has been taking Plavix for several years due to her prior history of TIA.  Advised to continue that.  Follow-up in 3 weeks to discuss bone marrow biopsy results.   Orders Placed This Encounter  Procedures   CT BONE MARROW BIOPSY &  ASPIRATION    Patient will need purple top collected for NGS testing on bone marrow specimen    Standing Status:   Future    Standing Expiration Date:   10/14/2022    Order Specific Question:   Reason for Exam (SYMPTOM  OR DIAGNOSIS REQUIRED)    Answer:   thrombocytosis, patient JAK 2 positive    Order Specific Question:   Preferred location?    Answer:   [facility]   Lactate dehydrogenase    Standing Status:   Future    Number of Occurrences:   1    Standing Expiration Date:   10/14/2022   BCR-ABL1 FISH    Standing Status:   Future    Number of Occurrences:   1    Standing Expiration Date:   10/14/2022    The total time spent in the appointment was 40 minutes encounter with patients including review of chart and various tests results, discussions about plan of care and coordination of care plan   All questions were answered. The patient knows to call the clinic with any problems, questions or concerns. No barriers to learning was detected.    Cassandra Canary, MD 8/22/20232:02 PM  INTERVAL HISTORY: Cassandra Johns 81 y.o. female returns for to discuss lab results for work up of thrombocytosis   SUMMARY OF HEMATOLOGIC HISTORY: Cassandra Johns is a 81 y.o. female with pmh of TIA and GERD Patient was seen as initial visit on 09/25/2021  for work up of thrombocytosis. Patient denied fever, unexplained weight loss, shortness of breath, chest pain, lumps, change in appetite, headache, vision changes, pruritis.  Denies any history of blood clots.  Reports episode of TIA in 2011.  Reports history of Raynaud's phenomenon 2-3 times a year.  Denies any rheumatologic related symptoms such as arthritis, dry eyes or mouth, rash, photosensitivity, oral ulcers, dysphagia, digital ulcers. Mother has history of scleroderma.  Denies family hx of thrombocytosis.    CBC with diff reviewed. Patient has elevated platelet count dating back to Aug 2019 and has been slowly trending up with most recent of 647 in  July 2023. WBC and HB is normal. Further work-up showed normal iron studies and ESR/CRP. JAK2 mutation positive.  I have reviewed the past medical history, past surgical history, social history and family history with the patient and they are unchanged from previous note.  INTERVAL HISTORY:  Patient was seen today as a follow-up to discuss about the lab results accompanied by her son.  She has been feeling well overall.  Reports that her GI symptoms have improved with the addition of Pepcid.  She reports her concerns about JAK2 positivity and has been looking up on Internet.  Patient denies fever, chills, nausea, vomiting, shortness of breath, cough, abdominal pain, bleeding, bowel or bladder issues. Energy level is good.  Appetite is good.  Denies any weight loss.  ALLERGIES:  is allergic to lactose intolerance (gi).  MEDICATIONS:  Current Outpatient Medications  Medication Sig Dispense Refill   acidophilus (RISAQUAD) CAPS capsule Take 1 capsule by mouth daily.     b complex vitamins tablet Take 1 tablet by mouth daily.     Beta Glucan 250 MG CAPS Take by mouth.     Cholecalciferol (VITAMIN D3) 125 MCG (5000 UT) CAPS Take 5,000 Units by mouth daily.     clopidogrel (PLAVIX) 75 MG tablet Take 75 mg by mouth daily.     Coenzyme Q10 400 MG CAPS Take 800 mg by mouth daily.      famotidine (PEPCID) 20 MG tablet Take 20 mg by mouth daily.     levothyroxine (SYNTHROID, LEVOTHROID) 75 MCG tablet Take 75 mcg by mouth daily before breakfast.     MAGNESIUM PO Take 1 tablet by mouth daily.     omega-3 acid ethyl esters (LOVAZA) 1 g capsule Take 1 g by mouth daily.      omeprazole (PRILOSEC) 20 MG capsule Take 20 mg by mouth daily before lunch.      OVER THE COUNTER MEDICATION Take 2 capsules by mouth daily. Bone Up     OVER THE COUNTER MEDICATION Take 1 tablet by mouth daily. ARTERIAL PROTECT     PREMARIN 0.45 MG tablet Take 0.45 mg by mouth daily.     Red Yeast Rice 600 MG CAPS Take 1,200 mg by  mouth Nightly.     RESVERATROL PO Take by mouth daily.     Turmeric 500 MG CAPS Take 1,000 mg by mouth daily.     vitamin E 400 UNIT capsule Take 400 Units by mouth every other day.      No current facility-administered medications for this visit.     REVIEW OF SYSTEMS:   Constitutional: Denies fevers, chills or night sweats Eyes: Denies blurriness of vision Ears, nose, mouth, throat, and face: Denies mucositis or sore throat Respiratory: Denies cough, dyspnea or wheezes Cardiovascular: Denies palpitation, chest discomfort or lower extremity swelling Gastrointestinal:  heartburn  Skin: Denies abnormal skin  rashes Lymphatics: Denies new lymphadenopathy or easy bruising Neurological:Denies numbness, tingling or new weaknesses Behavioral/Psych: Mood is stable, no new changes  All other systems were reviewed with the patient and are negative.  PHYSICAL EXAMINATION: ECOG PERFORMANCE STATUS: 1 - Symptomatic but completely ambulatory  Vitals:   10/13/21 1306  BP: 135/84  Pulse: 81  Resp: 18  Temp: 98.2 F (36.8 C)   Filed Weights   10/13/21 1306  Weight: 120 lb 1.6 oz (54.5 kg)    GENERAL:alert, no distress and comfortable SKIN: skin color, texture, turgor are normal, no rashes or significant lesions EYES: normal, Conjunctiva are pink and non-injected, sclera clear OROPHARYNX:no exudate, no erythema and lips, buccal mucosa, and tongue normal  NECK: supple, thyroid normal size, non-tender, without nodularity LYMPH:  no palpable lymphadenopathy in the cervical, axillary or inguinal LUNGS: clear to auscultation and percussion with normal breathing effort HEART: regular rate & rhythm and no murmurs and no lower extremity edema ABDOMEN:abdomen soft, non-tender and normal bowel sounds Musculoskeletal:no cyanosis of digits and no clubbing  NEURO: alert & oriented x 3 with fluent speech, no focal motor/sensory deficits  LABORATORY DATA:  I have reviewed the data as listed      Component Value Date/Time   NA 138 12/12/2019 1018   K 3.8 12/12/2019 1018   CL 100 12/12/2019 1018   CO2 28 12/12/2019 1018   GLUCOSE 77 12/12/2019 1018   BUN 15 12/12/2019 1018   CREATININE 1.00 12/12/2019 1018   CALCIUM 9.4 12/12/2019 1018   GFRNONAA 54 (L) 12/12/2019 1018    No results found for: "SPEP", "UPEP"  Lab Results  Component Value Date   WBC 5.4 09/25/2021   NEUTROABS 3.2 09/25/2021   HGB 13.2 09/25/2021   HCT 40.2 09/25/2021   MCV 98.5 09/25/2021   PLT 636 (H) 09/25/2021      Chemistry      Component Value Date/Time   NA 138 12/12/2019 1018   K 3.8 12/12/2019 1018   CL 100 12/12/2019 1018   CO2 28 12/12/2019 1018   BUN 15 12/12/2019 1018   CREATININE 1.00 12/12/2019 1018      Component Value Date/Time   CALCIUM 9.4 12/12/2019 1018       RADIOGRAPHIC STUDIES: I have personally reviewed the radiological images as listed and agreed with the findings in the report. No results found.

## 2021-10-13 NOTE — Progress Notes (Signed)
Patient here today for follow up regarding thrombocytosis.

## 2021-10-15 ENCOUNTER — Encounter: Payer: Self-pay | Admitting: *Deleted

## 2021-10-16 ENCOUNTER — Ambulatory Visit: Payer: Medicare Other | Admitting: Internal Medicine

## 2021-10-16 LAB — BCR-ABL1 FISH
Cells Analyzed: 200
Cells Counted: 200

## 2021-10-16 NOTE — Progress Notes (Signed)
Patient on schedule for BMB 8/30, called and spoke with patient on phone with pre procedure instructions given. Made aware to be here at  0730, NPO after MN prior to procedure as well as driver post procedure/recovery/discharge. Stated understanding.

## 2021-10-20 ENCOUNTER — Other Ambulatory Visit (HOSPITAL_COMMUNITY): Payer: Self-pay | Admitting: Student

## 2021-10-20 DIAGNOSIS — D75839 Thrombocytosis, unspecified: Secondary | ICD-10-CM

## 2021-10-20 NOTE — H&P (Signed)
Chief Complaint: Patient was seen in consultation today for thrombocytosis  at the request of [place]  Referring Physician(s): [place]  Supervising Physician: Ruthann Cancer  Patient Status: [facility] - Out-pt  History of Present Illness: Cassandra Johns is a 81 y.o. female referred to hematology for workup to evaluate for thrombocytosis. Pt has elevated platelet count dating back to August 2019. Platelet count has been showing trending up with most recent result 636. WBC and Hgb are WNL. Pt has been referred to IR for bone marrow biopsy to rule out essential thrombocythaemia by [name], MD.   Past Medical History:  Diagnosis Date   Chronic constipation    Diverticulosis    Esophagitis    GERD (gastroesophageal reflux disease)    History of colon polyps    Hyperlipidemia    Hypertension    h/o bp now controlled   Hypothyroidism    Stroke [facility])     TIA-2011   UTI (urinary tract infection)     Past Surgical History:  Procedure Laterality Date   ABDOMINAL HYSTERECTOMY     WITH BSO   BACK SURGERY     CERVICAL LAMINECTOMY   BROW LIFT Bilateral 08/08/2020   Procedure: BROW PTOSIS REPAIR BILATERAL;  Surgeon: Karle Starch, MD;  Location: [facility];  Service: Ophthalmology;  Laterality: Bilateral;   CERVICAL LAMINECTOMY     X 2   COLONOSCOPY     COLONOSCOPY WITH PROPOFOL N/A 02/28/2017   Procedure: COLONOSCOPY WITH PROPOFOL;  Surgeon: Lollie Sails, MD;  Location: [facility] ENDOSCOPY;  Service: Endoscopy;  Laterality: N/A;   CYSTOCELE REPAIR N/A 12/14/2019   Procedure: ANTERIOR REPAIR (CYSTOCELE);  Surgeon: Ward, Honor Loh, MD;  Location: [facility] ORS;  Service: Gynecology;  Laterality: N/A;   ESOPHAGOGASTRODUODENOSCOPY      Allergies: Lactose intolerance (gi)  Medications: Prior to Admission medications   Medication Sig Start Date End Date Taking? Authorizing Provider  acidophilus (RISAQUAD) CAPS capsule Take 1 capsule by mouth daily.    [provider]  b complex vitamins tablet Take 1 tablet by mouth daily.    [provider]  Beta Glucan 250 MG CAPS Take by mouth.    [provider]  Cholecalciferol (VITAMIN D3) 125 MCG (5000 UT) CAPS Take 5,000 Units by mouth daily.    [provider]  clopidogrel (PLAVIX) 75 MG tablet Take 75 mg by mouth daily.    [provider]  Coenzyme Q10 400 MG CAPS Take 800 mg by mouth daily.     [provider]  famotidine (PEPCID) 20 MG tablet Take 20 mg by mouth daily.    [provider]  levothyroxine (SYNTHROID, LEVOTHROID) 75 MCG tablet Take 75 mcg by mouth daily before breakfast.    [provider]  MAGNESIUM PO Take 1 tablet by mouth daily.    [provider]  omega-3 acid ethyl esters (LOVAZA) 1 g capsule Take 1 g by mouth daily.     [provider]  omeprazole (PRILOSEC) 20 MG capsule Take 20 mg by mouth daily before lunch.     [provider]  OVER THE COUNTER MEDICATION Take 2 capsules by mouth daily. Bone Up    [provider]  OVER THE COUNTER MEDICATION Take 1 tablet by mouth daily. ARTERIAL PROTECT    [provider]  PREMARIN 0.45 MG tablet Take 0.45 mg by mouth daily. 03/18/20   [provider]  Red Yeast Rice 600 MG CAPS Take 1,200 mg  by mouth Nightly.    [provider]  RESVERATROL PO Take by mouth daily.    [provider]  Turmeric 500 MG CAPS Take 1,000 mg by mouth daily.    [provider]  vitamin E 400 UNIT capsule Take 400 Units by mouth every other day.     [provider]     Family History  Problem Relation Age of Onset   Heart attack Father    Brain cancer Sister    Heart attack Brother    Breast cancer Paternal Grandmother    Breast cancer Maternal Aunt        40's    Social History   Socioeconomic History   Marital status: Married    Spouse name: Not on file   Number of children: Not on file   Years of  education: Not on file   Highest education level: Not on file  Occupational History   Not on file  Tobacco Use   Smoking status: Never   Smokeless tobacco: Never  Vaping Use   Vaping Use: Never used  Substance and Sexual Activity   Alcohol use: Yes    Comment: occ   Drug use: No   Sexual activity: Not on file  Other Topics Concern   Not on file  Social History Narrative   Not on file   Social Determinants of Health   Financial Resource Strain: Not on file  Food Insecurity: Not on file  Transportation Needs: Not on file  Physical Activity: Not on file  Stress: Not on file  Social Connections: Not on file    ECOG Status: {CHL ONC ECOG CZ:[id number]}  Review of Systems: A 12 point ROS discussed and pertinent positives are indicated in the HPI above.  All other systems are negative.  Review of Systems  Vital Signs: There were no vitals taken for this visit.  Advance Care Plan: {Advance Care UXNA:[id number]}    Physical Exam  Imaging: No results found.  Labs:  CBC: Recent Labs    09/25/21 1203  WBC 5.4  HGB 13.2  HCT 40.2  PLT 636*    COAGS: No results for input(s): "INR", "APTT" in the last 8760 hours.  BMP: No results for input(s): "NA", "K", "CL", "CO2", "GLUCOSE", "BUN", "CALCIUM", "CREATININE", "GFRNONAA", "GFRAA" in the last 8760 hours.  Invalid input(s): "CMP"  LIVER FUNCTION TESTS: No results for input(s): "BILITOT", "AST", "ALT", "ALKPHOS", "PROT", "ALBUMIN" in the last 8760 hours.  TUMOR MARKERS: No results for input(s): "AFPTM", "CEA", "CA199", "CHROMGRNA" in the last 8760 hours.  Assessment and Plan:  Risks and benefits of bone marrow biopsy and aspiration was discussed with the patient and/or patient's family including, but not limited to bleeding, infection, damage to adjacent structures or low yield requiring additional tests.  All of the questions were answered and there is agreement to proceed.  Consent signed and in chart.    Thank you for this interesting consult.  I greatly enjoyed meeting Cassandra Johns and look forward to participating in their care.  A copy of this report was sent to the requesting provider on this date.  Electronically Signed: Tyson Alias, NP 10/20/2021, 11:51 AM   I spent a total of {New UKGU:[id number]} {New Out-Pt:[id number]}  {Established Out-Pt:[id number]} in face to face in clinical consultation, greater than 50% of which was counseling/coordinating care for ***

## 2021-10-21 ENCOUNTER — Ambulatory Visit
Admission: RE | Admit: 2021-10-21 | Discharge: 2021-10-21 | Disposition: A | Discharge status: Home / Self Care | Payer: Medicare Other | Source: Ambulatory Visit | Attending: Internal Medicine | Admitting: Internal Medicine

## 2021-10-21 DIAGNOSIS — Z1379 Encounter for other screening for genetic and chromosomal anomalies: Secondary | ICD-10-CM | POA: Diagnosis not present

## 2021-10-21 DIAGNOSIS — D75839 Thrombocytosis, unspecified: Secondary | ICD-10-CM | POA: Insufficient documentation

## 2021-10-21 DIAGNOSIS — C946 Myelodysplastic disease, not classified: Secondary | ICD-10-CM | POA: Insufficient documentation

## 2021-10-21 LAB — CBC WITH DIFFERENTIAL/PLATELET
Abs Immature Granulocytes: 0.01 10*3/uL (ref 0.00–0.07)
Basophils Absolute: 0.1 10*3/uL (ref 0.0–0.1)
Basophils Relative: 1 %
Eosinophils Absolute: 0.2 10*3/uL (ref 0.0–0.5)
Eosinophils Relative: 4 %
HCT: 38.3 % (ref 36.0–46.0)
Hemoglobin: 12.3 g/dL (ref 12.0–15.0)
Immature Granulocytes: 0 %
Lymphocytes Relative: 31 %
Lymphs Abs: 1.6 10*3/uL (ref 0.7–4.0)
MCH: 31.3 pg (ref 26.0–34.0)
MCHC: 32.1 g/dL (ref 30.0–36.0)
MCV: 97.5 fL (ref 80.0–100.0)
Monocytes Absolute: 0.6 10*3/uL (ref 0.1–1.0)
Monocytes Relative: 11 %
Neutro Abs: 2.7 10*3/uL (ref 1.7–7.7)
Neutrophils Relative %: 53 %
Platelets: 548 10*3/uL — ABNORMAL HIGH (ref 150–400)
RBC: 3.93 MIL/uL (ref 3.87–5.11)
RDW: 13.5 % (ref 11.5–15.5)
WBC: 5 10*3/uL (ref 4.0–10.5)
nRBC: 0 % (ref 0.0–0.2)

## 2021-10-21 MED ORDER — FENTANYL CITRATE (PF) 100 MCG/2ML IJ SOLN
INTRAMUSCULAR | Status: AC | PRN
  Administered 2021-10-21: 50 ug via INTRAVENOUS

## 2021-10-21 MED ORDER — MIDAZOLAM HCL 5 MG/5ML IJ SOLN
INTRAMUSCULAR | Status: AC | PRN
  Administered 2021-10-21: 1 mg via INTRAVENOUS

## 2021-10-21 MED ORDER — SODIUM CHLORIDE 0.9 % IV SOLN: INTRAVENOUS | Status: DC

## 2021-10-21 MED ORDER — MIDAZOLAM HCL 2 MG/2ML IJ SOLN
INTRAMUSCULAR | Status: AC
  Filled 2021-10-21: qty 2

## 2021-10-21 MED ORDER — FENTANYL CITRATE (PF) 100 MCG/2ML IJ SOLN
INTRAMUSCULAR | Status: AC
  Filled 2021-10-21: qty 2

## 2021-10-21 MED ORDER — HEPARIN SOD (PORK) LOCK FLUSH 100 UNIT/ML IV SOLN
INTRAVENOUS | Status: AC
  Filled 2021-10-21: qty 5

## 2021-10-21 NOTE — Procedures (Signed)
Interventional Radiology Procedure Note  Procedure: CT guided aspirate and core biopsy of right iliac bone  Complications: None  Recommendations: - Bedrest supine x 1 hrs - Hydrocodone PRN  Pain - Follow biopsy results   Ilham Roughton, MD   

## 2021-10-27 LAB — SURGICAL PATHOLOGY

## 2021-10-28 ENCOUNTER — Encounter (HOSPITAL_COMMUNITY): Payer: Self-pay | Admitting: Internal Medicine

## 2021-10-30 ENCOUNTER — Inpatient Hospital Stay: Payer: Medicare Other | Admitting: Internal Medicine

## 2021-10-30 ENCOUNTER — Inpatient Hospital Stay: Payer: Medicare Other | Attending: Internal Medicine | Admitting: Internal Medicine

## 2021-10-30 ENCOUNTER — Encounter: Payer: Self-pay | Admitting: Internal Medicine

## 2021-10-30 VITALS — BP 143/62 | HR 66 | Temp 97.6°F | Resp 18

## 2021-10-30 DIAGNOSIS — Z8673 Personal history of transient ischemic attack (TIA), and cerebral infarction without residual deficits: Secondary | ICD-10-CM | POA: Diagnosis not present

## 2021-10-30 DIAGNOSIS — D75838 Other thrombocytosis: Secondary | ICD-10-CM | POA: Insufficient documentation

## 2021-10-30 DIAGNOSIS — D473 Essential (hemorrhagic) thrombocythemia: Secondary | ICD-10-CM | POA: Diagnosis not present

## 2021-10-30 DIAGNOSIS — Z7902 Long term (current) use of antithrombotics/antiplatelets: Secondary | ICD-10-CM | POA: Insufficient documentation

## 2021-10-30 MED ORDER — HYDROXYUREA 500 MG PO CAPS: 500.0000 mg | ORAL_CAPSULE | Freq: Every day | ORAL | 0 refills | Status: DC

## 2021-10-30 NOTE — Progress Notes (Signed)
[facility] OFFICE PROGRESS NOTE  Cassandra Late, MD  ASSESSMENT & PLAN:  Cassandra Johns is a 81 yo F with pmh of GERD and TIA was referred to hematology clinic for further work-up of thrombocytosis.  #Essential thrombocytosis, JAK2 mutation - Patient has elevated platelet count dating back to Aug 2019 and has been slowly trending up with most recent of 647 in July 2023. WBC and HB is normal. - Iron panel normal.  ESR CRP normal.  BCR-ABL by FISH negative.  LDH normal.  No splenomegaly on exam.  -Bone marrow biopsy from 10/21/2021 showed normocellular marrow, megakaryocytic atypia with hyperlobated nuclei and few hypolobated forms favoring ET. -Cytogenetics showed normal karyotype.  -Discussed about the diagnosis of essential thrombocytosis, natural history, prognosis and treatment.  Overall, patients with ET have good prognosis however there is a small risk of transformation to AML and myelofibrosis in the future. There are no medications available at this time that can reduce the risk of this transformation.    As per the IPSET thrombosis risk, she falls into high risk category due to her age and JAK2 positive.  We discussed in detail about the role of hydroxyurea as a cytoreductive therapy to reduce thrombotic risk.  Side effects such as anemia, neutropenia, oral ulcers, digital ulcers, rising LFTs were discussed.  There is also a risk of increased secondary malignancies such as skin cancer.  She follows up with dermatology on an annual basis for skin exam.  After detailed discussion, patient would like to proceed with hydroxyurea.  Start on 500 mg daily.  Repeat CBC with differential in 1 week.  Follow-up with me and labs in 2 weeks.  She will continue with Plavix for prior history of TIA.  Orders Placed This Encounter  Procedures   CBC with Differential    Standing Status:   Future    Standing Expiration Date:   10/30/2022   CBC with Differential    Standing Status:    Future    Standing Expiration Date:   10/30/2022   Comprehensive metabolic panel    Standing Status:   Future    Standing Expiration Date:   10/30/2022   Uric acid    Standing Status:   Future    Standing Expiration Date:   10/31/2022    The total time spent in the appointment was 35 minutes encounter with patients including review of chart and various tests results, discussions about plan of care and coordination of care plan   All questions were answered. The patient knows to call the clinic with any problems, questions or concerns. No barriers to learning was detected.    [name], MD 9/8/202312:22 PM  INTERVAL HISTORY: Cassandra Johns 81 y.o. female returns for to discuss lab results for work up of thrombocytosis   SUMMARY OF HEMATOLOGIC HISTORY: Cassandra Johns is a 81 y.o. female with pmh of TIA and GERD Patient was seen as initial visit on 09/25/2021 for work up of thrombocytosis. Patient denied fever, unexplained weight loss, shortness of breath, chest pain, lumps, change in appetite, headache, vision changes, pruritis.  Denies any history of blood clots.  Reports episode of TIA in 2011.  Reports history of Raynaud's phenomenon 2-3 times a year.  Denies any rheumatologic related symptoms such as arthritis, dry eyes or mouth, rash, photosensitivity, oral ulcers, dysphagia, digital ulcers. Mother has history of scleroderma.  Denies family hx of thrombocytosis.    CBC with diff reviewed. Patient has elevated platelet count  dating back to Aug 2019 and has been slowly trending up with most recent of 647 in July 2023. WBC and HB is normal. Further work-up showed normal iron studies and ESR/CRP. JAK2 mutation positive.  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  I have reviewed the past medical history, past surgical history, social history and family history with the patient and they are unchanged from previous note.  INTERVAL HISTORY:  Patient seen today accompanied by her sister to discuss bone marrow biopsy results.  Otherwise patient denies fever, chills, nausea, vomiting, shortness of breath, cough, abdominal pain, bleeding, bowel or bladder issues. Energy level is good.  Appetite is good.  Denies any weight loss.  ALLERGIES:  is allergic to lactose intolerance (gi).  MEDICATIONS:  Current Outpatient Medications  Medication Sig Dispense Refill   acidophilus (RISAQUAD) CAPS capsule Take 1 capsule by mouth daily.     b complex vitamins tablet Take 1 tablet by mouth daily.     Beta Glucan 250 MG CAPS Take by mouth.     Cholecalciferol (VITAMIN D3) 125 MCG (5000 UT) CAPS Take 5,000 Units by mouth daily.     clopidogrel (PLAVIX) 75 MG tablet Take 75 mg by mouth daily.     Coenzyme Q10 400 MG CAPS Take 800 mg by mouth daily.      famotidine (PEPCID) 20 MG tablet Take 20 mg by mouth daily.     hydroxyurea (HYDREA) 500 MG capsule Take 1 capsule (500 mg total) by mouth daily. May take with food to minimize GI side effects. 30 capsule 0   levothyroxine (SYNTHROID, LEVOTHROID) 75 MCG tablet Take 75 mcg by mouth daily before breakfast.     MAGNESIUM PO Take 1 tablet by mouth daily.     omega-3 acid ethyl esters (LOVAZA) 1 g capsule Take 1 g by mouth daily.      omeprazole (PRILOSEC) 20 MG capsule Take 20 mg by mouth daily before lunch.      OVER THE COUNTER MEDICATION Take 2 capsules by mouth daily. Bone Up     OVER THE COUNTER MEDICATION Take 1 tablet by mouth daily. ARTERIAL PROTECT     PREMARIN 0.45 MG tablet Take 0.45 mg by mouth  daily.     Red Yeast Rice 600 MG CAPS Take 1,200 mg by mouth Nightly.     RESVERATROL PO Take by mouth daily.     Turmeric 500 MG CAPS Take 1,000 mg by mouth daily.     vitamin E 400 UNIT capsule Take 400 Units by mouth every other day.      No current facility-administered medications for this visit.     REVIEW OF SYSTEMS:   Constitutional: Denies fevers, chills or night sweats Eyes: Denies blurriness of vision Ears, nose, mouth, throat, and face: Denies mucositis or sore throat Respiratory: Denies cough, dyspnea or wheezes Cardiovascular: Denies palpitation, chest discomfort or lower extremity swelling Gastrointestinal:  heartburn  Skin: Denies abnormal skin rashes Lymphatics: Denies new lymphadenopathy or easy bruising Neurological:Denies numbness, tingling or new weaknesses Behavioral/Psych: Mood is stable, no new changes  All other systems were reviewed with the patient and  are negative.  PHYSICAL EXAMINATION: ECOG PERFORMANCE STATUS: 1 - Symptomatic but completely ambulatory  Vitals:   10/30/21 1143  BP: (!) 143/62  Pulse: 66  Resp: 18  Temp: 97.6 F (36.4 C)  SpO2: 100%   There were no vitals filed for this visit.   Exam deferred. No complaints  LABORATORY DATA:  I have reviewed the data as listed     Component Value Date/Time   NA 138 12/12/2019 1018   K 3.8 12/12/2019 1018   CL 100 12/12/2019 1018   CO2 28 12/12/2019 1018   GLUCOSE 77 12/12/2019 1018   BUN 15 12/12/2019 1018   CREATININE 1.00 12/12/2019 1018   CALCIUM 9.4 12/12/2019 1018   GFRNONAA 54 (L) 12/12/2019 1018    No results found for: "SPEP", "UPEP"  Lab Results  Component Value Date   WBC 5.0 10/21/2021   NEUTROABS 2.7 10/21/2021   HGB 12.3 10/21/2021   HCT 38.3 10/21/2021   MCV 97.5 10/21/2021   PLT 548 (H) 10/21/2021      Chemistry      Component Value Date/Time   NA 138 12/12/2019 1018   K 3.8 12/12/2019 1018   CL 100 12/12/2019 1018   CO2 28 12/12/2019 1018   BUN 15  12/12/2019 1018   CREATININE 1.00 12/12/2019 1018      Component Value Date/Time   CALCIUM 9.4 12/12/2019 1018       RADIOGRAPHIC STUDIES: I have personally reviewed the radiological images as listed and agreed with the findings in the report. No results found.

## 2021-11-03 ENCOUNTER — Ambulatory Visit: Payer: Medicare Other | Admitting: Internal Medicine

## 2021-11-06 ENCOUNTER — Inpatient Hospital Stay: Payer: Medicare Other

## 2021-11-06 DIAGNOSIS — D75838 Other thrombocytosis: Secondary | ICD-10-CM | POA: Diagnosis not present

## 2021-11-06 DIAGNOSIS — D473 Essential (hemorrhagic) thrombocythemia: Secondary | ICD-10-CM

## 2021-11-06 LAB — CBC WITH DIFFERENTIAL/PLATELET
Abs Immature Granulocytes: 0.01 10*3/uL (ref 0.00–0.07)
Basophils Absolute: 0.1 10*3/uL (ref 0.0–0.1)
Basophils Relative: 1 %
Eosinophils Absolute: 0.2 10*3/uL (ref 0.0–0.5)
Eosinophils Relative: 3 %
HCT: 37.2 % (ref 36.0–46.0)
Hemoglobin: 12.1 g/dL (ref 12.0–15.0)
Immature Granulocytes: 0 %
Lymphocytes Relative: 27 %
Lymphs Abs: 1.6 10*3/uL (ref 0.7–4.0)
MCH: 32.2 pg (ref 26.0–34.0)
MCHC: 32.5 g/dL (ref 30.0–36.0)
MCV: 98.9 fL (ref 80.0–100.0)
Monocytes Absolute: 0.5 10*3/uL (ref 0.1–1.0)
Monocytes Relative: 8 %
Neutro Abs: 3.6 10*3/uL (ref 1.7–7.7)
Neutrophils Relative %: 61 %
Platelets: 530 10*3/uL — ABNORMAL HIGH (ref 150–400)
RBC: 3.76 MIL/uL — ABNORMAL LOW (ref 3.87–5.11)
RDW: 13.9 % (ref 11.5–15.5)
WBC: 5.9 10*3/uL (ref 4.0–10.5)
nRBC: 0 % (ref 0.0–0.2)

## 2021-11-06 LAB — URIC ACID: Uric Acid, Serum: 4.3 mg/dL (ref 2.5–7.1)

## 2021-11-13 ENCOUNTER — Encounter: Payer: Self-pay | Admitting: Internal Medicine

## 2021-11-13 ENCOUNTER — Inpatient Hospital Stay (HOSPITAL_BASED_OUTPATIENT_CLINIC_OR_DEPARTMENT_OTHER): Payer: Medicare Other | Admitting: Internal Medicine

## 2021-11-13 ENCOUNTER — Inpatient Hospital Stay: Payer: Medicare Other

## 2021-11-13 VITALS — BP 128/75 | HR 63 | Temp 96.2°F | Resp 16 | Wt 118.0 lb

## 2021-11-13 DIAGNOSIS — D473 Essential (hemorrhagic) thrombocythemia: Secondary | ICD-10-CM | POA: Diagnosis not present

## 2021-11-13 DIAGNOSIS — D75838 Other thrombocytosis: Secondary | ICD-10-CM | POA: Diagnosis not present

## 2021-11-13 LAB — CBC WITH DIFFERENTIAL/PLATELET
Abs Immature Granulocytes: 0.02 10*3/uL (ref 0.00–0.07)
Basophils Absolute: 0 10*3/uL (ref 0.0–0.1)
Basophils Relative: 1 %
Eosinophils Absolute: 0.2 10*3/uL (ref 0.0–0.5)
Eosinophils Relative: 4 %
HCT: 38.5 % (ref 36.0–46.0)
Hemoglobin: 12.9 g/dL (ref 12.0–15.0)
Immature Granulocytes: 0 %
Lymphocytes Relative: 34 %
Lymphs Abs: 1.9 10*3/uL (ref 0.7–4.0)
MCH: 32.7 pg (ref 26.0–34.0)
MCHC: 33.5 g/dL (ref 30.0–36.0)
MCV: 97.7 fL (ref 80.0–100.0)
Monocytes Absolute: 0.4 10*3/uL (ref 0.1–1.0)
Monocytes Relative: 7 %
Neutro Abs: 3 10*3/uL (ref 1.7–7.7)
Neutrophils Relative %: 54 %
Platelets: 461 10*3/uL — ABNORMAL HIGH (ref 150–400)
RBC: 3.94 MIL/uL (ref 3.87–5.11)
RDW: 14.1 % (ref 11.5–15.5)
WBC: 5.6 10*3/uL (ref 4.0–10.5)
nRBC: 0 % (ref 0.0–0.2)

## 2021-11-13 LAB — COMPREHENSIVE METABOLIC PANEL
ALT: 12 U/L (ref 0–44)
AST: 19 U/L (ref 15–41)
Albumin: 4.1 g/dL (ref 3.5–5.0)
Alkaline Phosphatase: 37 U/L — ABNORMAL LOW (ref 38–126)
Anion gap: 7 (ref 5–15)
BUN: 19 mg/dL (ref 8–23)
CO2: 30 mmol/L (ref 22–32)
Calcium: 9.4 mg/dL (ref 8.9–10.3)
Chloride: 103 mmol/L (ref 98–111)
Creatinine, Ser: 1.06 mg/dL — ABNORMAL HIGH (ref 0.44–1.00)
GFR, Estimated: 53 mL/min — ABNORMAL LOW (ref >60)
Glucose, Bld: 75 mg/dL (ref 70–99)
Potassium: 4.2 mmol/L (ref 3.5–5.1)
Sodium: 140 mmol/L (ref 135–145)
Total Bilirubin: 0.5 mg/dL (ref 0.3–1.2)
Total Protein: 7.4 g/dL (ref 6.5–8.1)

## 2021-11-13 MED ORDER — HYDROXYUREA 500 MG PO CAPS: 500.0000 mg | ORAL_CAPSULE | Freq: Every day | ORAL | 0 refills | Status: DC

## 2021-11-13 NOTE — Progress Notes (Signed)
Pt in for follow up, reports decreased appetite.

## 2021-11-13 NOTE — Progress Notes (Signed)
[place] OFFICE PROGRESS NOTE  CURRENT TREATMENT-  Hydroxyurea 500 mg daily started 10/13/2021  ASSESSMENT & PLAN:  Cassandra Johns is a 81 yo F with pmh of GERD and TIA was referred to hematology clinic for further work-up of thrombocytosis.  #Essential thrombocytosis, JAK2 mutation, high risk  - Patient has elevated platelet count dating back to Aug 2019 and has been slowly trending up with most recent of 647 in July 2023. WBC and HB is normal. - Iron panel normal.  ESR CRP normal.  BCR-ABL by FISH negative.  LDH normal.  No splenomegaly on exam.  -Bone marrow biopsy from 10/21/2021 showed normocellular marrow, megakaryocytic atypia with hyperlobated nuclei and few hypolobated forms favoring ET. -Cytogenetics showed normal karyotype. - As per the IPSET thrombosis risk, she falls into high risk category due to her age and JAK2 positive.  Patient started hydroxyurea couple of weeks ago.  She is tolerating well.  CBC and CMP reviewed.  Platelets trending down to 461.  Goal is less than 400.  WBC and platelets are normal.  LFTs normal.  Patient advised to increase the dose to 2 capsules on Saturday and Sunday and continue taking 1 capsule Monday to Friday. She will continue with Plavix for prior history of TIA.  RTC in 2 weeks for MD visit and labs.  Orders Placed This Encounter  Procedures   CBC with Differential    Standing Status:   Future    Standing Expiration Date:   11/13/2022    The total time spent in the appointment was 35 minutes encounter with patients including review of chart and various tests results, discussions about plan of care and coordination of care plan   All questions were answered. The patient knows to call the clinic with any problems, questions or concerns. No barriers to learning was detected.    Cassandra Canary, MD 9/22/20232:04 PM  INTERVAL HISTORY: Cassandra Johns 81 y.o. female returns for to discuss lab results for work up of  thrombocytosis   SUMMARY OF HEMATOLOGIC HISTORY: Cassandra Johns is a 81 y.o. female with pmh of TIA and GERD Patient was seen as initial visit on 09/25/2021 for work up of thrombocytosis. Patient denied fever, unexplained weight loss, shortness of breath, chest pain, lumps, change in appetite, headache, vision changes, pruritis.  Denies any history of blood clots.  Reports episode of TIA in 2011.  Reports history of Raynaud's phenomenon 2-3 times a year.  Patient has elevated platelet count dating back to Aug 2019 and has been slowly trending up with most recent of 647 in July 2023. WBC and HB is normal. Further work-up showed normal iron studies and ESR/CRP. JAK2 mutation positive.  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  I have reviewed the past medical history, past surgical history, social history and family history with the patient and they are unchanged from previous note.  INTERVAL HISTORY:  Patient seen today for toxicity check for hydroxyurea.  She is taking for a couple of weeks now and tolerating well.  She denies any complaints.  Reports she takes nap for 2 hours in the  afternoon but it was there even before taking Hydrea.  Patient denies fever, chills, nausea, vomiting, shortness of breath, cough, abdominal pain, bleeding, bowel or bladder issues.   ALLERGIES:  is allergic to lactose intolerance (gi).  MEDICATIONS:  Current Outpatient Medications  Medication Sig Dispense Refill   acidophilus (RISAQUAD) CAPS capsule Take 1 capsule by mouth  daily.     b complex vitamins tablet Take 1 tablet by mouth daily.     Beta Glucan 250 MG CAPS Take by mouth.     Cholecalciferol (VITAMIN D3) 125 MCG (5000 UT) CAPS Take 5,000 Units by mouth daily.     clopidogrel (PLAVIX) 75 MG tablet Take 75 mg by mouth daily.     Coenzyme Q10 400 MG CAPS Take 800 mg by mouth daily.      famotidine (PEPCID) 20 MG tablet Take 20 mg by mouth daily.     levothyroxine (SYNTHROID, LEVOTHROID) 75 MCG tablet Take 75 mcg by mouth daily before breakfast.     MAGNESIUM PO Take 1 tablet by mouth daily.     omega-3 acid ethyl esters (LOVAZA) 1 g capsule Take 1 g by mouth daily.      omeprazole (PRILOSEC) 20 MG capsule Take 20 mg by mouth daily before lunch.      OVER THE COUNTER MEDICATION Take 2 capsules by mouth daily. Bone Up     OVER THE COUNTER MEDICATION Take 1 tablet by mouth daily. ARTERIAL PROTECT     PREMARIN 0.45 MG tablet Take 0.45 mg by mouth daily.     Red Yeast Rice 600 MG CAPS Take 1,200 mg by mouth Nightly.     RESVERATROL PO Take by mouth daily.     Turmeric 500 MG CAPS Take 1,000 mg by mouth daily.     vitamin E 400 UNIT capsule Take 400 Units by mouth every other day.      hydroxyurea (HYDREA) 500 MG capsule Take 1 capsule (500 mg total) by mouth daily. Take 1 capsule daily Mon-Fri and take 2 capsules on Sat-Sun.  May take with food to minimize GI side effects. 90 capsule 0   No current facility-administered medications for this visit.     REVIEW OF SYSTEMS:   Constitutional: Denies fevers, chills or night sweats Eyes: Denies blurriness of vision Ears, nose, mouth, throat, and face: Denies mucositis or sore throat Respiratory: Denies cough, dyspnea or wheezes Cardiovascular: Denies palpitation, chest discomfort or lower extremity swelling Gastrointestinal:  heartburn  Skin: Denies abnormal skin rashes Lymphatics: Denies new lymphadenopathy or easy bruising Neurological:Denies numbness, tingling or new weaknesses Behavioral/Psych: Mood is  stable, no new changes  All other systems were reviewed with the patient and are negative.  PHYSICAL EXAMINATION: ECOG PERFORMANCE STATUS: 1 - Symptomatic but completely ambulatory  Vitals:   11/13/21 1351  BP: 128/75  Pulse: 63  Resp: 16  Temp: (!) 96.2 F (35.7 C)  SpO2: 100%    Filed Weights   11/13/21 1351  Weight: 118 lb (53.5 kg)     Exam deferred. No complaints  LABORATORY DATA:  I have reviewed the data as listed     Component Value Date/Time   NA 140 11/13/2021 1316   K 4.2 11/13/2021 1316   CL 103 11/13/2021 1316   CO2 30 11/13/2021 1316   GLUCOSE 75 11/13/2021 1316   BUN 19 11/13/2021 1316   CREATININE 1.06 (H) 11/13/2021 1316   CALCIUM 9.4 11/13/2021 1316   PROT 7.4 11/13/2021 1316   ALBUMIN 4.1 11/13/2021  1316   AST 19 11/13/2021 1316   ALT 12 11/13/2021 1316   ALKPHOS 37 (L) 11/13/2021 1316   BILITOT 0.5 11/13/2021 1316   GFRNONAA 53 (L) 11/13/2021 1316    No results found for: "SPEP", "UPEP"  Lab Results  Component Value Date   WBC 5.6 11/13/2021   NEUTROABS 3.0 11/13/2021   HGB 12.9 11/13/2021   HCT 38.5 11/13/2021   MCV 97.7 11/13/2021   PLT 461 (H) 11/13/2021      Chemistry      Component Value Date/Time   NA 140 11/13/2021 1316   K 4.2 11/13/2021 1316   CL 103 11/13/2021 1316   CO2 30 11/13/2021 1316   BUN 19 11/13/2021 1316   CREATININE 1.06 (H) 11/13/2021 1316      Component Value Date/Time   CALCIUM 9.4 11/13/2021 1316   ALKPHOS 37 (L) 11/13/2021 1316   AST 19 11/13/2021 1316   ALT 12 11/13/2021 1316   BILITOT 0.5 11/13/2021 1316       RADIOGRAPHIC STUDIES: I have personally reviewed the radiological images as listed and agreed with the findings in the report. No results found.

## 2021-11-17 ENCOUNTER — Other Ambulatory Visit: Payer: Self-pay | Admitting: Internal Medicine

## 2021-11-17 DIAGNOSIS — D473 Essential (hemorrhagic) thrombocythemia: Secondary | ICD-10-CM

## 2021-11-18 NOTE — Telephone Encounter (Signed)
CBC with Differential Order: [id number] Status: Final result    Visible to patient: Yes (seen)    Next appt: 11/27/2021 at 12:45 PM in Oncology ([facility])    Dx: Essential thrombocytosis ([place])    0 Result Notes         Component Ref Range & Units 5 d ago 12 d ago 4 wk ago 1 mo ago 1 yr ago  WBC 4.0 - 10.5 K/uL 5.6  5.9  5.0  5.4  5.6   RBC 3.87 - 5.11 MIL/uL 3.94  3.76 Low   3.93  4.08  4.06   Hemoglobin 12.0 - 15.0 g/dL 12.9  12.1  12.3  13.2  12.9   HCT 36.0 - 46.0 % 38.5  37.2  38.3  40.2  39.2   MCV 80.0 - 100.0 fL 97.7  98.9  97.5  98.5  96.6   MCH 26.0 - 34.0 pg 32.7  32.2  31.3  32.4  31.8   MCHC 30.0 - 36.0 g/dL 33.5  32.5  32.1  32.8  32.9   RDW 11.5 - 15.5 % 14.1  13.9  13.5  13.5  13.8   Platelets 150 - 400 K/uL 461 High   530 High   548 High   636 High   609 High    nRBC 0.0 - 0.2 % 0.0  0.0  0.0  0.0  0.0 CM   Neutrophils Relative % % 54  61  53  59    Neutro Abs 1.7 - 7.7 K/uL 3.0  3.6  2.7  3.2    Lymphocytes Relative % 34  '27  31  27    '$ Lymphs Abs 0.7 - 4.0 K/uL 1.9  1.6  1.6  1.5    Monocytes Relative % '7  8  11  9    '$ Monocytes Absolute 0.1 - 1.0 K/uL 0.4  0.5  0.6  0.5    Eosinophils Relative % '4  3  4  3    '$ Eosinophils Absolute 0.0 - 0.5 K/uL 0.2  0.2  0.2  0.2    Basophils Relative % '1  1  1  1    '$ Basophils Absolute 0.0 - 0.1 K/uL 0.0  0.1  0.1  0.0    Immature Granulocytes % 0  0  0  1    Abs Immature Granulocytes 0.00 - 0.07 K/uL 0.02  0.01 CM  0.01 CM  0.04 CM    Comment: Performed at [facility], [address] [place], [place] [address]  Resulting Agency  [facility] LAB [place] [facility] LAB [place] [facility] [place] [facility] LAB         Specimen Collected: 11/13/21 13:16 Last Resulted: 11/13/21 13:33      Lab Flowsheet    Order Details    View Encounter    Lab and Collection Details    Routing    Result History    View All Conversations on this Encounter      CM=Additional comments      Result Care Coordination   Patient Communication   Add  Comments   Seen Back to Top       Other Results from 11/13/2021   Contains abnormal data Comprehensive metabolic panel Order: [id number] Status: Final result    Visible to patient: Yes (seen)    Next appt: 11/27/2021 at 12:45 PM in Oncology ([facility])    Dx: Essential thrombocytosis ([facility])    0 Result Notes  Component Ref Range & Units 5 d ago 1 yr ago  Sodium 135 - 145 mmol/L 140  138   Potassium 3.5 - 5.1 mmol/L 4.2  3.8   Chloride 98 - 111 mmol/L 103  100   CO2 22 - 32 mmol/L 30  28   Glucose, Bld 70 - 99 mg/dL 75  77 CM   Comment: Glucose reference range applies only to samples taken after fasting for at least 8 hours.  BUN 8 - 23 mg/dL 19  15   Creatinine, Ser 0.44 - 1.00 mg/dL 1.06 High   1.00   Calcium 8.9 - 10.3 mg/dL 9.4  9.4   Total Protein 6.5 - 8.1 g/dL 7.4    Albumin 3.5 - 5.0 g/dL 4.1    AST 15 - 41 U/L 19    ALT 0 - 44 U/L 12    Alkaline Phosphatase 38 - 126 U/L 37 Low     Total Bilirubin 0.3 - 1.2 mg/dL 0.5    GFR, Estimated >60 mL/min 53 Low   54 Low    Comment: (NOTE)  Calculated using the CKD-EPI Creatinine Equation (2021)   Anion gap 5 - '15 7  10 '$ CM   Comment: Performed at [facility], [address] [place], [place] [address]  Resulting Agency  [facility] CLIN LAB [facility] CL[facility] LAB         Specimen Collected: 11/13/21 13:16 Last Resulted: 11/13/21 13:38

## 2021-11-24 ENCOUNTER — Other Ambulatory Visit: Payer: Self-pay | Admitting: *Deleted

## 2021-11-24 ENCOUNTER — Telehealth: Payer: Self-pay | Admitting: Dietician

## 2021-11-24 ENCOUNTER — Ambulatory Visit: Payer: Medicare Other | Admitting: Dietician

## 2021-11-24 DIAGNOSIS — D473 Essential (hemorrhagic) thrombocythemia: Secondary | ICD-10-CM

## 2021-11-24 MED ORDER — HYDROXYUREA 500 MG PO CAPS: 500.0000 mg | ORAL_CAPSULE | Freq: Every day | ORAL | 0 refills | Status: DC

## 2021-11-24 NOTE — Telephone Encounter (Signed)
Patient screened on MST. First attempt to reach. Provided my cell# on voice mail to return call to set up a nutrition consult. ° °Cyndi Aseem Sessums, RDN, LDN °Registered Dietitian, [facility] °Part Time Remote (Usual office hours: Tuesday-Thursday) °Cell: [phone]   °

## 2021-11-24 NOTE — Progress Notes (Signed)
Nutrition Assessment: Patient returned call and left voice message to call at home #.    Reason for Assessment: MST screen for weight loss  Spoke with patient remotely.  Patient reports lack of appetite past 6 weeks, she admits she has been skipping evening meal and just snacking on things like pop corn.  She is comfortable at current weight and want to maintain present weight.    Labs: 11/13/21  GFR 53, creat 1.06   Anthropometrics:   Height: 63" Weight:  11/13/21  118# 10/13/21  120# 09/25/21  123#  UBW: 123-125 BMI: 20.9  INTERVENTION:   Educated on importance of adequate calorie and protein energy intake  with nutrient dense foods when possible to maintain weight/strength  Encouraged high protein snack foods or ONS in evening as meal replacement.  Emailed Nutrition Tip sheet  for  high protein high calorie snacks Contact information provided, and encouraged if she can't maintain weight to reconnect  MONITORING, EVALUATION, GOAL: weight trends, nutrition impact symptoms, PO intake, labs   Next Visit: PRN at patient request  April Manson, RDN, LDN Registered Dietitian, [place] (Usual office hours: Tuesday-Thursday) Mobile: [phone] Remote Office: [phone]

## 2021-11-27 ENCOUNTER — Inpatient Hospital Stay: Payer: Medicare Other | Attending: Internal Medicine

## 2021-11-27 ENCOUNTER — Inpatient Hospital Stay (HOSPITAL_BASED_OUTPATIENT_CLINIC_OR_DEPARTMENT_OTHER): Payer: Medicare Other | Admitting: Internal Medicine

## 2021-11-27 ENCOUNTER — Encounter: Payer: Self-pay | Admitting: Internal Medicine

## 2021-11-27 VITALS — BP 121/78 | HR 66 | Temp 97.6°F | Resp 18 | Wt 119.6 lb

## 2021-11-27 DIAGNOSIS — D75838 Other thrombocytosis: Secondary | ICD-10-CM | POA: Diagnosis present

## 2021-11-27 DIAGNOSIS — D473 Essential (hemorrhagic) thrombocythemia: Secondary | ICD-10-CM | POA: Diagnosis present

## 2021-11-27 DIAGNOSIS — D75839 Thrombocytosis, unspecified: Secondary | ICD-10-CM | POA: Diagnosis not present

## 2021-11-27 LAB — CBC WITH DIFFERENTIAL/PLATELET
Abs Immature Granulocytes: 0.01 10*3/uL (ref 0.00–0.07)
Basophils Absolute: 0 10*3/uL (ref 0.0–0.1)
Basophils Relative: 1 %
Eosinophils Absolute: 0.2 10*3/uL (ref 0.0–0.5)
Eosinophils Relative: 4 %
HCT: 36.6 % (ref 36.0–46.0)
Hemoglobin: 12 g/dL (ref 12.0–15.0)
Immature Granulocytes: 0 %
Lymphocytes Relative: 30 %
Lymphs Abs: 1.3 10*3/uL (ref 0.7–4.0)
MCH: 32.8 pg (ref 26.0–34.0)
MCHC: 32.8 g/dL (ref 30.0–36.0)
MCV: 100 fL (ref 80.0–100.0)
Monocytes Absolute: 0.3 10*3/uL (ref 0.1–1.0)
Monocytes Relative: 7 %
Neutro Abs: 2.5 10*3/uL (ref 1.7–7.7)
Neutrophils Relative %: 58 %
Platelets: 324 10*3/uL (ref 150–400)
RBC: 3.66 MIL/uL — ABNORMAL LOW (ref 3.87–5.11)
RDW: 15.4 % (ref 11.5–15.5)
WBC: 4.3 10*3/uL (ref 4.0–10.5)
nRBC: 0 % (ref 0.0–0.2)

## 2021-11-27 NOTE — Progress Notes (Signed)
Cassandra Johns  CURRENT TREATMENT-  Hydroxyurea 500 mg daily started 10/30/2021  ASSESSMENT & PLAN:  Cassandra Johns is a 81 yo F with pmh of GERD and TIA was referred to hematology clinic for further work-up of thrombocytosis.  #Essential thrombocytosis, JAK2 mutation, high risk  - Patient has elevated platelet count dating back to Aug 2019 and has been slowly trending up with most recent of 647 in July 2023. WBC and HB is normal. - Iron panel normal.  ESR CRP normal.  BCR-ABL by FISH negative.  LDH normal.  No splenomegaly on exam.  -Bone marrow biopsy from 10/21/2021 showed normocellular marrow, megakaryocytic atypia with hyperlobated nuclei and few hypolobated forms favoring ET. -Cytogenetics showed normal karyotype. - As per the IPSET thrombosis risk, she falls into high risk category due to her age and JAK2 positive.  Patient started hydroxyurea couple of weeks ago.  She is tolerating well.  CBC and CMP reviewed.  Platelets trended down to 324.  Goal is less than 400.  WBC and platelets are normal. Continue with the same dose 1 capsule M-F and 2 capsules Sat-Sun  She will continue with Plavix for prior history of TIA.  RTC in 5 weeks for MD visit and labs.  Orders Placed This Encounter  Procedures   CBC with Differential    Standing Status:   Future    Standing Expiration Date:   11/27/2022   Comprehensive metabolic panel    Standing Status:   Future    Standing Expiration Date:   11/27/2022    The total time spent in the appointment was 35 minutes encounter with patients including review of chart and various tests results, discussions about plan of care and coordination of care plan   All questions were answered. The patient knows to call the clinic with any problems, questions or concerns. No barriers to learning was detected.    Cassandra Canary, MD 10/6/20231:31 PM  INTERVAL HISTORY: Cassandra Johns 81 y.o. female returns for to discuss lab  results for work up of thrombocytosis   SUMMARY OF HEMATOLOGIC HISTORY: Cassandra Johns is a 81 y.o. female with pmh of TIA and GERD Patient was seen as initial visit on 09/25/2021 for work up of thrombocytosis. Patient denied fever, unexplained weight loss, shortness of breath, chest pain, lumps, change in appetite, headache, vision changes, pruritis.  Denies any history of blood clots.  Reports episode of TIA in 2011.  Reports history of Raynaud's phenomenon 2-3 times a year.  Patient has elevated platelet count dating back to Aug 2019 and has been slowly trending up with most recent of 647 in July 2023. WBC and HB is normal. Further work-up showed normal iron studies and ESR/CRP. JAK2 mutation positive.  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  I have reviewed the past medical history, past surgical history, social history and family history with the patient and they are unchanged from previous Johns.  INTERVAL HISTORY:  Patient seen today for toxicity check for hydroxyurea.  She is taking for a 4 weeks now and tolerating well.  She denies any complaints.  Reports she takes nap for 2 hours in the afternoon but it was there even before taking Hydrea.  Patient denies fever, chills, nausea, vomiting, shortness of breath, cough, abdominal pain, bleeding, bowel or bladder issues.   ALLERGIES:  is allergic to lactose intolerance (gi).  MEDICATIONS:  Current Outpatient Medications  Medication Sig Dispense Refill   acidophilus (RISAQUAD) CAPS capsule Take 1  capsule by mouth daily.     b complex vitamins tablet Take 1 tablet by mouth daily.     Beta Glucan 250 MG CAPS Take by mouth.     Cholecalciferol (VITAMIN D3) 125 MCG (5000 UT) CAPS Take 5,000 Units by mouth daily.     clopidogrel (PLAVIX) 75 MG tablet Take 75 mg by mouth daily.     Coenzyme Q10 400 MG CAPS Take 800 mg by mouth daily.      famotidine (PEPCID) 20 MG tablet Take 20 mg by mouth daily.     hydroxyurea (HYDREA) 500 MG capsule Take 1 capsule (500 mg total) by mouth daily. Take 1 capsule daily mon-Friday and 2 capsules sat-sunday 90 capsule 0   levothyroxine (SYNTHROID, LEVOTHROID) 75 MCG tablet Take 75 mcg by mouth daily before breakfast.     MAGNESIUM PO Take 1 tablet by mouth daily.     omega-3 acid ethyl esters (LOVAZA) 1 g capsule Take 1 g by mouth daily.      omeprazole (PRILOSEC) 20 MG capsule Take 20 mg by mouth daily before lunch.      OVER THE COUNTER MEDICATION Take 2 capsules by mouth daily. Bone Up     OVER THE COUNTER MEDICATION Take 1 tablet by mouth daily. ARTERIAL PROTECT     PREMARIN 0.45 MG tablet Take 0.45 mg by mouth daily.     Red Yeast Rice 600 MG CAPS Take 1,200 mg by mouth Nightly.     RESVERATROL PO Take by mouth daily.     Turmeric 500 MG CAPS Take 1,000 mg by mouth daily.     vitamin E 400 UNIT capsule Take 400 Units by mouth every other day.      No current facility-administered medications for this visit.     REVIEW OF SYSTEMS:   Constitutional: Denies fevers, chills or night sweats Eyes: Denies blurriness of vision Ears, nose, mouth, throat, and face: Denies mucositis or sore throat Respiratory: Denies cough, dyspnea or wheezes Cardiovascular: Denies palpitation, chest discomfort or lower extremity swelling Gastrointestinal:  heartburn  Skin: Denies abnormal skin rashes Lymphatics: Denies new lymphadenopathy or easy bruising Neurological:Denies numbness, tingling or new weaknesses Behavioral/Psych: Mood is stable, no new changes  All other  systems were reviewed with the patient and are negative.  PHYSICAL EXAMINATION: ECOG PERFORMANCE STATUS: 1 - Symptomatic but completely ambulatory  There were no vitals filed for this visit.   There were no vitals filed for this visit.    Exam deferred. No complaints  LABORATORY DATA:  I have reviewed the data as listed     Component Value Date/Time   NA 140 11/13/2021 1316   K 4.2 11/13/2021 1316   CL 103 11/13/2021 1316   CO2 30 11/13/2021 1316   GLUCOSE 75 11/13/2021 1316   BUN 19 11/13/2021 1316   CREATININE 1.06 (H) 11/13/2021 1316   CALCIUM 9.4 11/13/2021 1316   PROT 7.4 11/13/2021 1316   ALBUMIN 4.1 11/13/2021 1316   AST 19 11/13/2021 1316   ALT 12 11/13/2021 1316   ALKPHOS 37 (L) 11/13/2021 1316   BILITOT 0.5 11/13/2021  [place] (L) 11/13/2021 1316    No results found for: "SPEP", "UPEP"  Lab Results  Component Value Date   WBC 4.3 11/27/2021   NEUTROABS 2.5 11/27/2021   HGB 12.0 11/27/2021   HCT 36.6 11/27/2021   MCV 100.0 11/27/2021   PLT 324 11/27/2021      Chemistry      Component Value Date/Time   NA 140 11/13/2021 1316   K 4.2 11/13/2021 1316   CL 103 11/13/2021 1316   CO2 30 11/13/2021 1316   BUN 19 11/13/2021 1316   CREATININE 1.06 (H) 11/13/2021 1316      Component Value Date/Time   CALCIUM 9.4 11/13/2021 1316   ALKPHOS 37 (L) 11/13/2021 1316   AST 19 11/13/2021 1316   ALT 12 11/13/2021 1316   BILITOT 0.5 11/13/2021 1316       RADIOGRAPHIC STUDIES: I have personally reviewed the radiological images as listed and agreed with the findings in the report. No results found.

## 2021-11-27 NOTE — Progress Notes (Signed)
Patient denies concerns today.  

## 2021-11-30 ENCOUNTER — Telehealth: Payer: Self-pay | Admitting: Internal Medicine

## 2021-11-30 ENCOUNTER — Telehealth: Payer: Self-pay | Admitting: *Deleted

## 2021-11-30 NOTE — Telephone Encounter (Signed)
Patient  sister Ulis Rias called reporting that since afternoon yesterday, has had a fever of 101.5 and has generalized body aches. She does not have a sore throat, congestion or headache. She cannot describe anything except she feels really bad and her whole body aches. She is on Hydrea. She feels patient needs to be seen . Please advise

## 2021-11-30 NOTE — Telephone Encounter (Signed)
Per secure chat, Dr. Darrall Dears will reach out to patient to recommend follow up with PCP

## 2021-11-30 NOTE — Telephone Encounter (Addendum)
I spoke with the patient over the phone.  She reported fever of 100.24F and 100.78F. has generalized body aches. I saw her last Friday and CBC with diff was normal. She is on hydroxyurea for ET.   Advised to reach out to PCP to be seen today for infectious work up. If unable to get in, she will reach out to me . Advised to hold off on hydroxyurea for 1 week. I will recheck on her next week.   ADDENDUM 12/08/21 Patient f/u with PCP. Was diagnosed with COVID. Completed paxlovid. Feeling better. No more fevers.   Advised to resume hydroxyurea this Saturday 10/21 to give about 2 weeks to recover. Pt agreeable.  F/u with me on 11/10 with labs,

## 2021-12-07 ENCOUNTER — Telehealth: Payer: Self-pay | Admitting: Internal Medicine

## 2021-12-07 NOTE — Telephone Encounter (Signed)
Patient states she is on the mend for COVID and would like to let you know. Does she need to start taking hydroxyurea again or what is next? Please advise.

## 2021-12-08 NOTE — Telephone Encounter (Signed)
Secure chat sent to Dr. Darrall Dears, Dr. Darrall Dears is going to reach out to patient to discuss.

## 2022-01-01 ENCOUNTER — Ambulatory Visit: Payer: Medicare Other | Admitting: Internal Medicine

## 2022-01-01 ENCOUNTER — Other Ambulatory Visit: Payer: Medicare Other

## 2022-01-05 ENCOUNTER — Inpatient Hospital Stay (HOSPITAL_BASED_OUTPATIENT_CLINIC_OR_DEPARTMENT_OTHER): Payer: Medicare Other | Admitting: Internal Medicine

## 2022-01-05 ENCOUNTER — Inpatient Hospital Stay: Payer: Medicare Other | Attending: Internal Medicine

## 2022-01-05 ENCOUNTER — Encounter: Payer: Self-pay | Admitting: Internal Medicine

## 2022-01-05 VITALS — BP 139/68 | HR 74 | Temp 96.6°F | Resp 20 | Wt 117.9 lb

## 2022-01-05 DIAGNOSIS — D75839 Thrombocytosis, unspecified: Secondary | ICD-10-CM | POA: Diagnosis not present

## 2022-01-05 DIAGNOSIS — D473 Essential (hemorrhagic) thrombocythemia: Secondary | ICD-10-CM | POA: Diagnosis not present

## 2022-01-05 LAB — COMPREHENSIVE METABOLIC PANEL
ALT: 13 U/L (ref 0–44)
AST: 19 U/L (ref 15–41)
Albumin: 3.8 g/dL (ref 3.5–5.0)
Alkaline Phosphatase: 41 U/L (ref 38–126)
Anion gap: 7 (ref 5–15)
BUN: 26 mg/dL — ABNORMAL HIGH (ref 8–23)
CO2: 31 mmol/L (ref 22–32)
Calcium: 9.6 mg/dL (ref 8.9–10.3)
Chloride: 103 mmol/L (ref 98–111)
Creatinine, Ser: 0.89 mg/dL (ref 0.44–1.00)
GFR, Estimated: >60 mL/min (ref >60)
Glucose, Bld: 84 mg/dL (ref 70–99)
Potassium: 3.7 mmol/L (ref 3.5–5.1)
Sodium: 141 mmol/L (ref 135–145)
Total Bilirubin: 0.4 mg/dL (ref 0.3–1.2)
Total Protein: 7 g/dL (ref 6.5–8.1)

## 2022-01-05 LAB — CBC WITH DIFFERENTIAL/PLATELET
Abs Immature Granulocytes: 0.01 10*3/uL (ref 0.00–0.07)
Basophils Absolute: 0 10*3/uL (ref 0.0–0.1)
Basophils Relative: 1 %
Eosinophils Absolute: 0.1 10*3/uL (ref 0.0–0.5)
Eosinophils Relative: 3 %
HCT: 36.1 % (ref 36.0–46.0)
Hemoglobin: 11.8 g/dL — ABNORMAL LOW (ref 12.0–15.0)
Immature Granulocytes: 0 %
Lymphocytes Relative: 36 %
Lymphs Abs: 1.6 10*3/uL (ref 0.7–4.0)
MCH: 34 pg (ref 26.0–34.0)
MCHC: 32.7 g/dL (ref 30.0–36.0)
MCV: 104 fL — ABNORMAL HIGH (ref 80.0–100.0)
Monocytes Absolute: 0.4 10*3/uL (ref 0.1–1.0)
Monocytes Relative: 9 %
Neutro Abs: 2.3 10*3/uL (ref 1.7–7.7)
Neutrophils Relative %: 51 %
Platelets: 392 10*3/uL (ref 150–400)
RBC: 3.47 MIL/uL — ABNORMAL LOW (ref 3.87–5.11)
RDW: 17.6 % — ABNORMAL HIGH (ref 11.5–15.5)
WBC: 4.5 10*3/uL (ref 4.0–10.5)
nRBC: 0 % (ref 0.0–0.2)

## 2022-01-05 NOTE — Progress Notes (Signed)
[place] OFFICE PROGRESS NOTE  CURRENT TREATMENT-  Hydroxyurea 500 mg daily started 10/30/2021  ASSESSMENT & PLAN:  Cassandra Johns is a 81 yo F with pmh of GERD and TIA was referred to hematology clinic for further work-up of thrombocytosis.  #Essential thrombocytosis, JAK2 mutation, high risk  - Patient has elevated platelet count dating back to Aug 2019 and has been slowly trending up with most recent of 647 in July 2023. WBC and HB is normal. - Iron panel normal.  ESR CRP normal.  BCR-ABL by FISH negative.  LDH normal.  No splenomegaly on exam.  -Bone marrow biopsy from 10/21/2021 showed normocellular marrow, megakaryocytic atypia with hyperlobated nuclei and few hypolobated forms favoring ET. Cytogenetics showed normal karyotype.  Patient had COVID infection in October 2023 and hydroxyurea was held for couple of weeks.  She is back on her schedule of 500 mg to be weekdays and 1000 mg during weekends.  CBC with differential reviewed today.  Platelets 392.  Mild anemia of 11.8. will monitor.  Continue with current dose.  She will continue with Plavix for prior history of TIA.  RTC in 10 weeks for MD visit, labs.  Orders Placed This Encounter  Procedures   CBC with Differential    Standing Status:   Future    Standing Expiration Date:   01/05/2023    The total time spent in the appointment was 35 minutes encounter with patients including review of chart and various tests results, discussions about plan of care and coordination of care plan   All questions were answered. The patient knows to call the clinic with any problems, questions or concerns. No barriers to learning was detected.    [name], MD 11/14/20234:43 PM  INTERVAL HISTORY: Cassandra Johns 81 y.o. female returns for to discuss lab results for work up of thrombocytosis   SUMMARY OF HEMATOLOGIC HISTORY: Cassandra Johns is a 81 y.o. female with pmh of TIA and GERD Patient was seen as initial visit on  09/25/2021 for work up of thrombocytosis. Patient denied fever, unexplained weight loss, shortness of breath, chest pain, lumps, change in appetite, headache, vision changes, pruritis.  Denies any history of blood clots.  Reports episode of TIA in 2011.  Reports history of Raynaud's phenomenon 2-3 times a year.  Patient has elevated platelet count dating back to Aug 2019 and has been slowly trending up with most recent of 647 in July 2023. WBC and HB is normal. Further work-up showed normal iron studies and ESR/CRP. JAK2 mutation positive.  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  I have reviewed the past medical history, past surgical history, social history and family history with the patient and they are unchanged from previous note.  INTERVAL HISTORY:  Patient seen today for toxicity check for hydroxyurea.  Patient had COVID in October 2023 and Paxlovid.  She has recovered now.  She resumed her hydroxyurea and is tolerating well.  Patient denies fever, chills, nausea, vomiting, shortness of breath, cough, abdominal pain, bleeding, bowel or bladder issues. Energy level is good.  Appetite is good.  Denies any weight loss.  Denies pain.   ALLERGIES:  is allergic to lactose intolerance (gi).  MEDICATIONS:  Current Outpatient Medications  Medication Sig Dispense Refill   acidophilus (RISAQUAD) CAPS capsule Take 1 capsule by mouth daily.     b complex vitamins tablet Take 1 tablet by mouth daily.     Beta Glucan 250 MG CAPS Take by mouth.      Cholecalciferol (VITAMIN D3) 125 MCG (5000 UT) CAPS Take 5,000 Units by mouth daily.     clopidogrel (PLAVIX) 75 MG tablet Take 75 mg by mouth daily.     Coenzyme Q10 400 MG CAPS Take 800 mg by mouth daily.      famotidine (PEPCID) 20 MG tablet Take 20 mg by mouth daily.     hydroxyurea (HYDREA) 500 MG capsule Take 1 capsule (500 mg total) by mouth daily. Take 1 capsule daily mon-Friday and 2 capsules sat-sunday 90 capsule 0   levothyroxine (SYNTHROID, LEVOTHROID) 75 MCG tablet Take 75 mcg by mouth daily before breakfast.     MAGNESIUM PO Take 1 tablet by mouth daily.     omega-3 acid ethyl esters (LOVAZA) 1 g capsule Take 1 g by mouth daily.      omeprazole (PRILOSEC) 20 MG capsule Take 20 mg by mouth daily before lunch.      OVER THE COUNTER MEDICATION Take 2 capsules by mouth daily. Bone Up     OVER THE COUNTER MEDICATION Take 1 tablet by mouth daily. ARTERIAL PROTECT     PREMARIN 0.45 MG tablet Take 0.45 mg by mouth daily.     Red Yeast Rice 600 MG CAPS Take 1,200 mg by mouth Nightly.     RESVERATROL PO Take by mouth daily.     Turmeric 500 MG CAPS Take 1,000 mg by mouth daily.     vitamin E 400 UNIT capsule Take 400 Units by mouth every other day.      No current facility-administered medications for this visit.     REVIEW OF SYSTEMS:   Constitutional: Denies fevers, chills or night sweats Eyes: Denies blurriness of vision Ears, nose, mouth, throat, and face: Denies mucositis or sore throat Respiratory: Denies cough, dyspnea or wheezes Cardiovascular: Denies palpitation, chest discomfort or lower extremity swelling Gastrointestinal:  heartburn  Skin: Denies abnormal skin rashes Lymphatics: Denies new lymphadenopathy or easy bruising Neurological:Denies numbness, tingling or new weaknesses Behavioral/Psych: Mood is stable, no new changes  All other systems were reviewed with the patient and are negative.  PHYSICAL EXAMINATION: ECOG PERFORMANCE STATUS: 1 - Symptomatic but  completely ambulatory  Vitals:   01/05/22 1603  BP: 139/68  Pulse: 74  Resp: 20  Temp: (!) 96.6 F (35.9 C)  SpO2: 100%     Filed Weights   01/05/22 1603  Weight: 117 lb 14.4 oz (53.5 kg)      Exam deferred. No complaints  LABORATORY DATA:  I have reviewed the data as listed     Component Value Date/Time   NA 141 01/05/2022 1547   K 3.7 01/05/2022 1547   CL 103 01/05/2022 1547   CO2 31 01/05/2022 1547   GLUCOSE 84 01/05/2022 1547   BUN 26 (H) 01/05/2022 1547   CREATININE 0.89 01/05/2022 1547   CALCIUM 9.6 01/05/2022 1547   PROT 7.0 01/05/2022 1547   ALBUMIN 3.8 01/05/2022 1547   AST 19 01/05/2022 1547   ALT 13 01/05/2022 1547   ALKPHOS 41 01/05/2022 1547   BILITOT 0.4 01/05/2022 1547   GFRNONAA >60 01/05/2022 1547  No results found for: "SPEP", "UPEP"  Lab Results  Component Value Date   WBC 4.5 01/05/2022   NEUTROABS 2.3 01/05/2022   HGB 11.8 (L) 01/05/2022   HCT 36.1 01/05/2022   MCV 104.0 (H) 01/05/2022   PLT 392 01/05/2022      Chemistry      Component Value Date/Time   NA 141 01/05/2022 1547   K 3.7 01/05/2022 1547   CL 103 01/05/2022 1547   CO2 31 01/05/2022 1547   BUN 26 (H) 01/05/2022 1547   CREATININE 0.89 01/05/2022 1547      Component Value Date/Time   CALCIUM 9.6 01/05/2022 1547   ALKPHOS 41 01/05/2022 1547   AST 19 01/05/2022 1547   ALT 13 01/05/2022 1547   BILITOT 0.4 01/05/2022 1547       RADIOGRAPHIC STUDIES: I have personally reviewed the radiological images as listed and agreed with the findings in the report. No results found.

## 2022-01-19 ENCOUNTER — Telehealth: Payer: Self-pay | Admitting: *Deleted

## 2022-01-19 NOTE — Telephone Encounter (Signed)
Patient called asking if she could speak with Dr Darrall Dears, I asked what sher concern was and she said she wants to know if she should be referred to cardiology since she is at risk for clotting and she has concerns of being to to eat more protein since she is watching her cholesterol.

## 2022-02-04 ENCOUNTER — Inpatient Hospital Stay: Payer: Medicare Other | Attending: Internal Medicine

## 2022-02-04 NOTE — Progress Notes (Signed)
Nutrition Follow-up:  Patient followed by Dr Darrall Dears for thrombocytosis.    Spoke with patient via phone. Patient has questions regarding foods that contain protein but want make her cholesterol increase.  Reports that appetite is better following COVID.  Has purchased a protein powder and making smoothies.  Having a hard time getting it blended up and not having a gritty taste.      Medications: reviewed  Labs: reviewed  Anthropometrics:   117 lb on 11/14  Weight 118 lb on 9/22 120 lb on 8/22  UBW of 123-125 lb  NUTRITION DIAGNOSIS: Food and nutrition related knowledge deficit related to foods containing protein as evidenced by wanting to speak with RD   INTERVENTION:  Discussed food options high in protein that would not be high in cholesterol to include in her diet (lean chicken, fish, [place], beans, peanut butter, nuts, egg whites, lowfat diary products).   RD available if needed for further questions   NEXT VISIT: no follow-up  Patient to contact RD if needed  Cassandra Johns, [place], [place] Registered Dietitian [phone]

## 2022-02-10 ENCOUNTER — Other Ambulatory Visit: Payer: Self-pay | Admitting: *Deleted

## 2022-02-10 DIAGNOSIS — D473 Essential (hemorrhagic) thrombocythemia: Secondary | ICD-10-CM

## 2022-02-10 MED ORDER — HYDROXYUREA 500 MG PO CAPS: 500.0000 mg | ORAL_CAPSULE | Freq: Every day | ORAL | 0 refills | Status: AC

## 2022-02-10 NOTE — Telephone Encounter (Signed)
            Component Ref Range & Units 1 mo ago (01/05/22) 2 mo ago (11/27/21) 2 mo ago (11/13/21) 3 mo ago (11/06/21) 3 mo ago (10/21/21) 4 mo ago (09/25/21) 2 yr ago (12/12/19)  WBC 4.0 - 10.5 K/uL 4.5 4.3 5.6 5.9 5.0 5.4 5.6  RBC 3.87 - 5.11 MIL/uL 3.47 Low  3.66 Low  3.94 3.76 Low  3.93 4.08 4.06  Hemoglobin 12.0 - 15.0 g/dL 11.8 Low  12.0 12.9 12.1 12.3 13.2 12.9  HCT 36.0 - 46.0 % 36.1 36.6 38.5 37.2 38.3 40.2 39.2  MCV 80.0 - 100.0 fL 104.0 High  100.0 97.7 98.9 97.5 98.5 96.6  MCH 26.0 - 34.0 pg 34.0 32.8 32.7 32.2 31.3 32.4 31.8  MCHC 30.0 - 36.0 g/dL 32.7 32.8 33.5 32.5 32.1 32.8 32.9  RDW 11.5 - 15.5 % 17.6 High  15.4 14.1 13.9 13.5 13.5 13.8  Platelets 150 - 400 K/uL 392 324 461 High  530 High  548 High  636 High  609 High   nRBC 0.0 - 0.2 % 0.0 0.0 0.0 0.0 0.0 0.0 0.0 CM  Neutrophils Relative % % 51 58 54 61 53 59   Neutro Abs 1.7 - 7.7 K/uL 2.3 2.5 3.0 3.6 2.7 3.2   Lymphocytes Relative % 36 30 34 '27 31 27   '$ Lymphs Abs 0.7 - 4.0 K/uL 1.6 1.3 1.9 1.6 1.6 1.5   Monocytes Relative % '9 7 7 8 11 9   '$ Monocytes Absolute 0.1 - 1.0 K/uL 0.4 0.3 0.4 0.5 0.6 0.5   Eosinophils Relative % '3 4 4 3 4 3   '$ Eosinophils Absolute 0.0 - 0.5 K/uL 0.1 0.2 0.2 0.2 0.2 0.2   Basophils Relative % '1 1 1 1 1 1   '$ Basophils Absolute 0.0 - 0.1 K/uL 0.0 0.0 0.0 0.1 0.1 0.0   Immature Granulocytes % 0 0 0 0 0 1   Abs Immature Granulocytes 0.00 - 0.07 K/uL 0.01 0.01 CM 0.02 CM 0.01 CM 0.01 CM 0.04 CM    Component Ref Range & Units 1 mo ago 2 mo ago 2 yr ago  Sodium 135 - 145 mmol/L 141 140 138  Potassium 3.5 - 5.1 mmol/L 3.7 4.2 3.8  Chloride 98 - 111 mmol/L 103 103 100  CO2 22 - 32 mmol/L '31 30 28  '$ Glucose, Bld 70 - 99 mg/dL 84 75 CM 77 CM  Comment: Glucose reference range applies only to samples taken after fasting for at least 8 hours.  BUN 8 - 23 mg/dL 26 High  19 15  Creatinine, Ser 0.44 - 1.00 mg/dL 0.89 1.06 High  1.00  Calcium 8.9 - 10.3 mg/dL 9.6 9.4 9.4  Total Protein 6.5 - 8.1 g/dL 7.0  7.4   Albumin 3.5 - 5.0 g/dL 3.8 4.1   AST 15 - 41 U/L 19 19   ALT 0 - 44 U/L 13 12   Alkaline Phosphatase 38 - 126 U/L 41 37 Low    Total Bilirubin 0.3 - 1.2 mg/dL 0.4 0.5   GFR, Estimated >60 mL/min >60 53 Low  CM 54 Low

## 2022-03-16 ENCOUNTER — Inpatient Hospital Stay: Payer: Medicare Other | Attending: Internal Medicine

## 2022-03-16 ENCOUNTER — Encounter: Payer: Self-pay | Admitting: Internal Medicine

## 2022-03-16 ENCOUNTER — Inpatient Hospital Stay (HOSPITAL_BASED_OUTPATIENT_CLINIC_OR_DEPARTMENT_OTHER): Payer: Medicare Other | Admitting: Internal Medicine

## 2022-03-16 VITALS — BP 135/75 | HR 69 | Temp 97.8°F | Resp 20 | Wt 120.2 lb

## 2022-03-16 DIAGNOSIS — D75839 Thrombocytosis, unspecified: Secondary | ICD-10-CM | POA: Diagnosis not present

## 2022-03-16 DIAGNOSIS — D649 Anemia, unspecified: Secondary | ICD-10-CM | POA: Insufficient documentation

## 2022-03-16 DIAGNOSIS — D473 Essential (hemorrhagic) thrombocythemia: Secondary | ICD-10-CM | POA: Insufficient documentation

## 2022-03-16 DIAGNOSIS — D702 Other drug-induced agranulocytosis: Secondary | ICD-10-CM

## 2022-03-16 LAB — CBC WITH DIFFERENTIAL/PLATELET
Abs Immature Granulocytes: 0.01 10*3/uL (ref 0.00–0.07)
Basophils Absolute: 0 10*3/uL (ref 0.0–0.1)
Basophils Relative: 1 %
Eosinophils Absolute: 0.1 10*3/uL (ref 0.0–0.5)
Eosinophils Relative: 2 %
HCT: 35 % — ABNORMAL LOW (ref 36.0–46.0)
Hemoglobin: 11.9 g/dL — ABNORMAL LOW (ref 12.0–15.0)
Immature Granulocytes: 0 %
Lymphocytes Relative: 45 %
Lymphs Abs: 1.4 10*3/uL (ref 0.7–4.0)
MCH: 38.4 pg — ABNORMAL HIGH (ref 26.0–34.0)
MCHC: 34 g/dL (ref 30.0–36.0)
MCV: 112.9 fL — ABNORMAL HIGH (ref 80.0–100.0)
Monocytes Absolute: 0.3 10*3/uL (ref 0.1–1.0)
Monocytes Relative: 11 %
Neutro Abs: 1.2 10*3/uL — ABNORMAL LOW (ref 1.7–7.7)
Neutrophils Relative %: 41 %
Platelets: 328 10*3/uL (ref 150–400)
RBC: 3.1 MIL/uL — ABNORMAL LOW (ref 3.87–5.11)
RDW: 14.5 % (ref 11.5–15.5)
WBC: 3 10*3/uL — ABNORMAL LOW (ref 4.0–10.5)
nRBC: 0 % (ref 0.0–0.2)

## 2022-03-16 NOTE — Progress Notes (Signed)
[place] OFFICE PROGRESS NOTE  CURRENT TREATMENT-  Hydroxyurea 500 mg on weekdays and 1000 mg on weekends.   ASSESSMENT & PLAN:  Cassandra Johns is a 82 yo F with pmh of GERD and TIA was referred to hematology clinic for further work-up of thrombocytosis.  #Essential thrombocytosis, JAK2 mutation, high risk  - Patient has elevated platelet count dating back to Aug 2019 and has been slowly trending up with most recent of 647 in July 2023. WBC and HB is normal. - Iron panel normal.  ESR CRP normal.  BCR-ABL by FISH negative.  LDH normal.  No splenomegaly on exam.  -Bone marrow biopsy from 10/21/2021 showed normocellular marrow, megakaryocytic atypia with hyperlobated nuclei and few hypolobated forms favoring ET. Cytogenetics showed normal karyotype.  -Patient has been on hydroxyurea 500 mg on weekdays and 1000 mg on weekends.  CBC with differential reviewed today.  WBC 3 and ANC 1.2. mild anemia, stable. Advised the patient to decrease the dose of hydroxyurea to 500 mg daily.  We will repeat CBC with differential in 2 weeks.  If her [place] continues to drop will consider holding hydroxyurea.  Or if her [place] does not recover would consider decreasing the dose of hydroxyurea further to 500 mg on weekdays only.  She will continue with Plavix for prior history of TIA.   Orders Placed This Encounter  Procedures   CBC with Differential/Platelet    Standing Status:   Future    Standing Expiration Date:   03/16/2023   Comprehensive metabolic panel    Standing Status:   Future    Standing Expiration Date:   03/16/2023   RTC in 2 weeks with APP labs. RTC in 5 weeks with Dr. Loni Muse, labs.  The total time spent in the appointment was 30 minutes encounter with patients including review of chart and various tests results, discussions about plan of care and coordination of care plan   All questions were answered. The patient knows to call the clinic with any problems, questions or concerns. No  barriers to learning was detected.    Cassandra Canary, MD 1/26/20241:18 PM  INTERVAL HISTORY: Cassandra Johns 82 y.o. female returns for to discuss lab results for work up of thrombocytosis   SUMMARY OF HEMATOLOGIC HISTORY: Cassandra Johns is a 82 y.o. female with pmh of TIA and GERD  Patient was seen as initial visit on 09/25/2021 for work up of thrombocytosis. Patient denied fever, unexplained weight loss, shortness of breath, chest pain, lumps, change in appetite, headache, vision changes, pruritis.  Denies any history of blood clots. Reports episode of TIA in 2011.  Reports history of Raynaud's phenomenon 2-3 times a year.  Patient has elevated platelet count dating back to Aug 2019 and has been slowly trending up with most recent of 647 in July 2023. WBC and HB is normal. Further work-up showed normal iron studies and ESR/CRP. JAK2 mutation positive.  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  I have reviewed the past medical history, past surgical history, social history and family history with the patient and they are unchanged from previous note.  INTERVAL HISTORY:  Patient seen today for toxicity check for hydroxyurea.  Patient denies fever, chills, nausea, vomiting, shortness of breath, cough, abdominal pain, bleeding, bowel or bladder issues. Energy level is good.  Appetite is good.  Denies any weight loss. Denies pain.   ALLERGIES:  is allergic to lactose intolerance (gi).  MEDICATIONS:  Current Outpatient Medications  Medication Sig  Dispense Refill   acidophilus (RISAQUAD) CAPS capsule Take 1 capsule by mouth daily.     b complex vitamins tablet Take 1 tablet by mouth daily.     Beta Glucan 250 MG CAPS Take by mouth.     Cholecalciferol (VITAMIN D3) 125 MCG (5000 UT) CAPS Take 5,000 Units by mouth daily.     clopidogrel (PLAVIX) 75 MG tablet Take 75 mg by mouth daily.     Coenzyme Q10 400 MG CAPS Take 800 mg by mouth daily.      famotidine (PEPCID) 20 MG tablet Take 20 mg by mouth daily.     hydroxyurea (HYDREA) 500 MG capsule Take 1 capsule (500 mg total) by mouth daily. Take 1 capsule daily mon-Friday and 2 capsules sat-sunday 90 capsule 0   levothyroxine (SYNTHROID, LEVOTHROID) 75 MCG tablet Take 75 mcg by mouth daily before breakfast.     MAGNESIUM PO Take 1 tablet by mouth daily.     omega-3 acid ethyl esters (LOVAZA) 1 g capsule Take 1 g by mouth daily.      omeprazole (PRILOSEC) 20 MG capsule Take 20 mg by mouth daily before lunch.      OVER THE COUNTER MEDICATION Take 2 capsules by mouth daily. Bone Up     OVER THE COUNTER MEDICATION Take 1 tablet by mouth daily. ARTERIAL PROTECT     PREMARIN 0.45 MG tablet Take 0.45 mg by mouth daily.     Red Yeast Rice 600 MG CAPS Take 1,200 mg by mouth Nightly.     RESVERATROL PO Take by mouth daily.     Turmeric 500 MG CAPS Take 1,000 mg by mouth daily.     vitamin E 400 UNIT capsule Take 400 Units by mouth every other day.      No current facility-administered medications for this visit.     REVIEW OF SYSTEMS:   Constitutional: Denies fevers, chills or night sweats Eyes: Denies blurriness of vision Ears, nose, mouth, throat, and face: Denies mucositis or sore throat Respiratory: Denies cough, dyspnea or wheezes Cardiovascular: Denies palpitation, chest discomfort or lower extremity swelling Gastrointestinal:  heartburn  Skin: Denies abnormal skin rashes Lymphatics: Denies new lymphadenopathy or easy bruising Neurological:Denies numbness, tingling or new  weaknesses Behavioral/Psych: Mood is stable, no new changes  All other systems were reviewed with the patient and are negative.  PHYSICAL EXAMINATION: ECOG PERFORMANCE STATUS: 1 - Symptomatic but completely ambulatory  Vitals:   03/16/22 1529  BP: 135/75  Pulse: 69  Resp: 20  Temp: 97.8 F (36.6 C)  SpO2: 100%      Filed Weights   03/16/22 1529  Weight: 120 lb 3.2 oz (54.5 kg)       Exam deferred. No complaints  LABORATORY DATA:  I have reviewed the data as listed     Component Value Date/Time   NA 141 01/05/2022 1547   K 3.7 01/05/2022 1547   CL 103 01/05/2022 1547   CO2 31 01/05/2022 1547   GLUCOSE 84 01/05/2022 1547   BUN 26 (H) 01/05/2022 1547   CREATININE 0.89 01/05/2022 1547   CALCIUM 9.6 01/05/2022 1547  PROT 7.0 01/05/2022 1547   ALBUMIN 3.8 01/05/2022 1547   AST 19 01/05/2022 1547   ALT 13 01/05/2022 1547   ALKPHOS 41 01/05/2022 1547   BILITOT 0.4 01/05/2022 1547   GFRNONAA >60 01/05/2022 1547    No results found for: "SPEP", "UPEP"  Lab Results  Component Value Date   WBC 3.0 (L) 03/16/2022   NEUTROABS 1.2 (L) 03/16/2022   HGB 11.9 (L) 03/16/2022   HCT 35.0 (L) 03/16/2022   MCV 112.9 (H) 03/16/2022   PLT 328 03/16/2022      Chemistry      Component Value Date/Time   NA 141 01/05/2022 1547   K 3.7 01/05/2022 1547   CL 103 01/05/2022 1547   CO2 31 01/05/2022 1547   BUN 26 (H) 01/05/2022 1547   CREATININE 0.89 01/05/2022 1547      Component Value Date/Time   CALCIUM 9.6 01/05/2022 1547   ALKPHOS 41 01/05/2022 1547   AST 19 01/05/2022 1547   ALT 13 01/05/2022 1547   BILITOT 0.4 01/05/2022 1547       RADIOGRAPHIC STUDIES: I have personally reviewed the radiological images as listed and agreed with the findings in the report. No results found.

## 2022-03-19 DIAGNOSIS — D702 Other drug-induced agranulocytosis: Secondary | ICD-10-CM | POA: Insufficient documentation

## 2022-03-30 ENCOUNTER — Inpatient Hospital Stay: Payer: Medicare Other | Admitting: Nurse Practitioner

## 2022-03-30 ENCOUNTER — Inpatient Hospital Stay: Payer: Medicare Other | Attending: Internal Medicine

## 2022-03-30 DIAGNOSIS — D7589 Other specified diseases of blood and blood-forming organs: Secondary | ICD-10-CM | POA: Insufficient documentation

## 2022-03-30 DIAGNOSIS — D473 Essential (hemorrhagic) thrombocythemia: Secondary | ICD-10-CM | POA: Insufficient documentation

## 2022-03-30 DIAGNOSIS — D75839 Thrombocytosis, unspecified: Secondary | ICD-10-CM | POA: Insufficient documentation

## 2022-03-30 DIAGNOSIS — D702 Other drug-induced agranulocytosis: Secondary | ICD-10-CM

## 2022-03-30 LAB — CBC WITH DIFFERENTIAL/PLATELET
Abs Immature Granulocytes: 0.01 10*3/uL (ref 0.00–0.07)
Basophils Absolute: 0 10*3/uL (ref 0.0–0.1)
Basophils Relative: 1 %
Eosinophils Absolute: 0.1 10*3/uL (ref 0.0–0.5)
Eosinophils Relative: 2 %
HCT: 36.1 % (ref 36.0–46.0)
Hemoglobin: 12.1 g/dL (ref 12.0–15.0)
Immature Granulocytes: 0 %
Lymphocytes Relative: 43 %
Lymphs Abs: 1.7 10*3/uL (ref 0.7–4.0)
MCH: 38.5 pg — ABNORMAL HIGH (ref 26.0–34.0)
MCHC: 33.5 g/dL (ref 30.0–36.0)
MCV: 115 fL — ABNORMAL HIGH (ref 80.0–100.0)
Monocytes Absolute: 0.3 10*3/uL (ref 0.1–1.0)
Monocytes Relative: 8 %
Neutro Abs: 1.8 10*3/uL (ref 1.7–7.7)
Neutrophils Relative %: 46 %
Platelets: 207 10*3/uL (ref 150–400)
RBC: 3.14 MIL/uL — ABNORMAL LOW (ref 3.87–5.11)
RDW: 13.6 % (ref 11.5–15.5)
WBC: 3.9 10*3/uL — ABNORMAL LOW (ref 4.0–10.5)
nRBC: 0 % (ref 0.0–0.2)

## 2022-03-31 ENCOUNTER — Inpatient Hospital Stay (HOSPITAL_BASED_OUTPATIENT_CLINIC_OR_DEPARTMENT_OTHER): Payer: Medicare Other | Admitting: Nurse Practitioner

## 2022-03-31 ENCOUNTER — Encounter: Payer: Self-pay | Admitting: Nurse Practitioner

## 2022-03-31 VITALS — BP 134/71 | HR 61 | Temp 98.6°F | Resp 20 | Wt 119.0 lb

## 2022-03-31 DIAGNOSIS — D473 Essential (hemorrhagic) thrombocythemia: Secondary | ICD-10-CM

## 2022-03-31 DIAGNOSIS — D702 Other drug-induced agranulocytosis: Secondary | ICD-10-CM

## 2022-03-31 NOTE — Progress Notes (Signed)
Patient has a question in regards to her recent blood work.

## 2022-03-31 NOTE — Progress Notes (Signed)
[facility] NOTE  CURRENT TREATMENT-  Hydroxyurea 500 mg 7 days a week  INTERVAL HISTORY: Cassandra Johns 82 y.o. female returns for to discuss lab results for work up of thrombocytosis   SUMMARY OF HEMATOLOGIC HISTORY: Cassandra Johns is a 82 y.o. female with pmh of TIA and GERD  Patient was seen as initial visit on 09/25/2021 for work up of thrombocytosis. Patient denied fever, unexplained weight loss, shortness of breath, chest pain, lumps, change in appetite, headache, vision changes, pruritis.  Denies any history of blood clots. Reports episode of TIA in 2011.  Reports history of Raynaud's phenomenon 2-3 times a year.  Patient has elevated platelet count dating back to Aug 2019 and has been slowly trending up with most recent of 647 in July 2023. WBC and HB is normal. Further work-up showed normal iron studies and ESR/CRP. JAK2 mutation positive.  Bone marrow biopsy on 10/21/2021 showed  DIAGNOSIS:   BONE MARROW, ASPIRATE, CLOT, CORE:  - Normocellular marrow (20%) involved by JAK2 mutated myeloproliferative neoplasm  COMMENT: The overall findings of persistent and progressive peripheral blood thrombocytosis, along with a normocellular marrow, proliferation and atypia of megakaryocytes, and positive JAK2-mutation testing are consistent with a myeloproliferative neoplasm with essential thrombocythemia being favored.   INTERVAL HISTORY:  Patient seen today for toxicity check for hydroxyurea. ANC was previously decreased and hydrea dose was changed to 1 pill daily. She denies interval infections. Feels well. Worried about her bloodwork.    ALLERGIES:  is allergic to lactose intolerance (gi).  MEDICATIONS:  Current Outpatient Medications  Medication Sig Dispense Refill   acidophilus (RISAQUAD) CAPS capsule Take 1 capsule by mouth daily.     b complex vitamins tablet Take 1 tablet by mouth daily.     Beta Glucan 250 MG CAPS Take by  mouth.     Cholecalciferol (VITAMIN D3) 125 MCG (5000 UT) CAPS Take 5,000 Units by mouth daily.     clopidogrel (PLAVIX) 75 MG tablet Take 75 mg by mouth daily.     Coenzyme Q10 400 MG CAPS Take 800 mg by mouth daily.      famotidine (PEPCID) 20 MG tablet Take 20 mg by mouth daily.     hydroxyurea (HYDREA) 500 MG capsule Take 1 capsule (500 mg total) by mouth daily. Take 1 capsule daily mon-Friday and 2 capsules sat-sunday 90 capsule 0   levothyroxine (SYNTHROID, LEVOTHROID) 75 MCG tablet Take 75 mcg by mouth daily before breakfast.     MAGNESIUM PO Take 1 tablet by mouth daily.     omega-3 acid ethyl esters (LOVAZA) 1 g capsule Take 1 g by mouth daily.      omeprazole (PRILOSEC) 20 MG capsule Take 20 mg by mouth daily before lunch.      OVER THE COUNTER MEDICATION Take 2 capsules by mouth daily. Bone Up     OVER THE COUNTER MEDICATION Take 1 tablet by mouth daily. ARTERIAL PROTECT     PREMARIN 0.45 MG tablet Take 0.45 mg by mouth daily.     Red Yeast Rice 600 MG CAPS Take 1,200 mg by mouth Nightly.     RESVERATROL PO Take by mouth daily.     Turmeric 500 MG CAPS Take 1,000 mg by mouth daily.     vitamin E 400 UNIT capsule Take 400 Units by mouth every other day.      No current facility-administered medications for this visit.    Review of Systems  Constitutional:  Negative for chills, diaphoresis, fever, malaise/fatigue and weight loss.  HENT:  Negative for congestion, ear discharge, ear pain, nosebleeds, sinus pain and sore throat.   Eyes:  Negative for pain and redness.  Respiratory:  Negative for cough, hemoptysis, sputum production, shortness of breath, wheezing and stridor.   Cardiovascular:  Negative for chest pain, palpitations and leg swelling.  Gastrointestinal:  Negative for abdominal pain, constipation, diarrhea, nausea and vomiting.  Musculoskeletal:  Negative for falls, joint pain and myalgias.  Skin:  Negative for rash.  Neurological:  Negative for dizziness, loss of  consciousness, weakness and headaches.  Psychiatric/Behavioral:  Negative for depression. The patient is nervous/anxious. The patient does not have insomnia.   All other systems reviewed and are negative.   PHYSICAL EXAMINATION: ECOG PERFORMANCE STATUS: 1 - Symptomatic but completely ambulatory Vitals:   03/31/22 1529  BP: 134/71  Pulse: 61  Resp: 20  Temp: 98.6 F (37 C)  SpO2: 100%   Filed Weights   03/31/22 1529  Weight: 119 lb (54 kg)   Physical Exam Constitutional:      Appearance: She is not ill-appearing.  Pulmonary:     Effort: No respiratory distress.  Skin:    Coloration: Skin is not pale.     Findings: No bruising.  Neurological:     Mental Status: She is alert and oriented to person, place, and time.  Psychiatric:        Attention and Perception: Attention normal.        Mood and Affect: Mood is anxious.    LABORATORY DATA:  I have reviewed the data as listed Lab Results  Component Value Date   WBC 3.9 (L) 03/30/2022   NEUTROABS 1.8 03/30/2022   HGB 12.1 03/30/2022   HCT 36.1 03/30/2022   MCV 115.0 (H) 03/30/2022   PLT 207 03/30/2022    RADIOGRAPHIC STUDIES: I have personally reviewed the radiological images as listed and agreed with the findings in the report. No results found.  ASSESSMENT & PLAN:  Cassandra Johns is a 82 yo F with pmh of GERD and TIA was referred to hematology clinic for further work-up of thrombocytosis.  #Essential thrombocytosis, JAK2 mutation, high risk - elevated plt count in August 2019 then uptrending. 647 July 2023. Bone marrow biopsy on 10/21/21 favoring ET. Previously on hydroxyurea 500 mg on weekdays and 1000 mg on weekends. [place] had dropped to 1.2 at recent bloodwork. Hydrea was reduced to 500 mg daily. Repeat labs today. WBC improved to 3.9, ANC 1.8. Ok to continue current dose for now. If ANC drops would consider holding hydrea until anc normalizes then resuming at reduced dose. Would consider 500 mg 5 days a week.    Macrocytosis- reviewed bloodwork in detail. Likely related to hydrea as b12 was previously normal. Could repeat though if warranted.   Disposition:  RTC in 3 weeks with labs & Dr. Darrall Dears  The total time spent in the appointment was 20 minutes encounter with patients including review of chart and various tests results, discussions about plan of care and coordination of care plan  All questions were answered. The patient knows to call the clinic with any problems, questions or concerns. No barriers to learning was detected.  Verlon Au, NP 03/31/2022

## 2022-04-01 ENCOUNTER — Inpatient Hospital Stay: Payer: Medicare Other | Admitting: Nurse Practitioner

## 2022-04-20 ENCOUNTER — Inpatient Hospital Stay: Payer: Medicare Other

## 2022-04-20 ENCOUNTER — Inpatient Hospital Stay (HOSPITAL_BASED_OUTPATIENT_CLINIC_OR_DEPARTMENT_OTHER): Payer: Medicare Other | Admitting: Internal Medicine

## 2022-04-20 ENCOUNTER — Encounter: Payer: Self-pay | Admitting: Internal Medicine

## 2022-04-20 VITALS — BP 135/72 | HR 66 | Temp 98.9°F | Resp 20 | Wt 119.7 lb

## 2022-04-20 DIAGNOSIS — D702 Other drug-induced agranulocytosis: Secondary | ICD-10-CM

## 2022-04-20 DIAGNOSIS — D473 Essential (hemorrhagic) thrombocythemia: Secondary | ICD-10-CM

## 2022-04-20 LAB — COMPREHENSIVE METABOLIC PANEL
ALT: 14 U/L (ref 0–44)
AST: 19 U/L (ref 15–41)
Albumin: 3.8 g/dL (ref 3.5–5.0)
Alkaline Phosphatase: 41 U/L (ref 38–126)
Anion gap: 7 (ref 5–15)
BUN: 23 mg/dL (ref 8–23)
CO2: 29 mmol/L (ref 22–32)
Calcium: 9.2 mg/dL (ref 8.9–10.3)
Chloride: 101 mmol/L (ref 98–111)
Creatinine, Ser: 0.99 mg/dL (ref 0.44–1.00)
GFR, Estimated: 58 mL/min — ABNORMAL LOW (ref >60)
Glucose, Bld: 92 mg/dL (ref 70–99)
Potassium: 4.2 mmol/L (ref 3.5–5.1)
Sodium: 137 mmol/L (ref 135–145)
Total Bilirubin: 0.4 mg/dL (ref 0.3–1.2)
Total Protein: 6.8 g/dL (ref 6.5–8.1)

## 2022-04-20 LAB — CBC WITH DIFFERENTIAL/PLATELET
Abs Immature Granulocytes: 0.01 10*3/uL (ref 0.00–0.07)
Basophils Absolute: 0 10*3/uL (ref 0.0–0.1)
Basophils Relative: 1 %
Eosinophils Absolute: 0.1 10*3/uL (ref 0.0–0.5)
Eosinophils Relative: 2 %
HCT: 34.5 % — ABNORMAL LOW (ref 36.0–46.0)
Hemoglobin: 11.4 g/dL — ABNORMAL LOW (ref 12.0–15.0)
Immature Granulocytes: 0 %
Lymphocytes Relative: 40 %
Lymphs Abs: 1.5 10*3/uL (ref 0.7–4.0)
MCH: 38.1 pg — ABNORMAL HIGH (ref 26.0–34.0)
MCHC: 33 g/dL (ref 30.0–36.0)
MCV: 115.4 fL — ABNORMAL HIGH (ref 80.0–100.0)
Monocytes Absolute: 0.4 10*3/uL (ref 0.1–1.0)
Monocytes Relative: 12 %
Neutro Abs: 1.6 10*3/uL — ABNORMAL LOW (ref 1.7–7.7)
Neutrophils Relative %: 45 %
Platelets: 307 10*3/uL (ref 150–400)
RBC: 2.99 MIL/uL — ABNORMAL LOW (ref 3.87–5.11)
RDW: 12.9 % (ref 11.5–15.5)
WBC: 3.6 10*3/uL — ABNORMAL LOW (ref 4.0–10.5)
nRBC: 0 % (ref 0.0–0.2)

## 2022-04-20 NOTE — Progress Notes (Signed)
[facility] OFFICE PROGRESS NOTE  CURRENT TREATMENT-  Hydroxyurea 500 mg on weekdays and 1000 mg on weekends.   ASSESSMENT & PLAN:  Cassandra Johns is a 82 yo F with pmh of GERD and TIA was referred to hematology clinic for further work-up of thrombocytosis.  #Essential thrombocytosis, JAK2 mutation, high risk  - Patient has elevated platelet count dating back to Aug 2019 and has been slowly trending up with most recent of 647 in July 2023. WBC and HB is normal. - Iron panel normal.  ESR CRP normal.  BCR-ABL by FISH negative.  LDH normal.  No splenomegaly on exam.  -Bone marrow biopsy from 10/21/2021 showed normocellular marrow, megakaryocytic atypia with hyperlobated nuclei and few hypolobated forms favoring ET. Cytogenetics showed normal karyotype.  -Patient is currently on hydroxyurea 500 mg daily.  She had improvement in [place] after dose reduction.  ANC today 1.6.  Will continue with the same dose.  Repeat CBC with differential in 4 weeks.  If ANC is trending down we will decrease the dose of hydroxyurea to 500 mg on weekdays.  She will continue with Plavix for prior history of TIA.   Orders Placed This Encounter  Procedures   CBC with Differential/Platelet    Standing Status:   Future    Standing Expiration Date:   04/20/2023   RTC in 4 weeks for MD visit, labs.  The total time spent in the appointment was 30 minutes encounter with patients including review of chart and various tests results, discussions about plan of care and coordination of care plan   All questions were answered. The patient knows to call the clinic with any problems, questions or concerns. No barriers to learning was detected.    [name], MD 2/27/20243:51 PM  INTERVAL HISTORY: Cassandra Johns 82 y.o. female returns for to discuss lab results for work up of thrombocytosis   SUMMARY OF HEMATOLOGIC HISTORY: Cassandra Johns is a 82 y.o. female with pmh of TIA and GERD  Patient was seen as  initial visit on 09/25/2021 for work up of thrombocytosis. Patient denied fever, unexplained weight loss, shortness of breath, chest pain, lumps, change in appetite, headache, vision changes, pruritis.  Denies any history of blood clots. Reports episode of TIA in 2011.  Reports history of Raynaud's phenomenon 2-3 times a year.  Patient has elevated platelet count dating back to Aug 2019 and has been slowly trending up with most recent of 647 in July 2023. WBC and HB is normal. Further work-up showed normal iron studies and ESR/CRP. JAK2 mutation positive.  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  I have reviewed the past medical history, past surgical history, social history and family history with the patient and they are unchanged from previous note.  INTERVAL HISTORY:  Patient seen today for toxicity check for hydroxyurea.  Patient reports difficulty getting up in the morning after starting hydroxyurea.  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  However when she gets up and get going does not have any issues.  She notices cotton candy mouth early in the morning but wafter she drinks water she feels fine throughout the day.   ALLERGIES:  is allergic to lactose intolerance (gi).  MEDICATIONS:  Current Outpatient Medications  Medication Sig Dispense Refill   acidophilus (RISAQUAD) CAPS capsule Take 1 capsule by mouth daily.     b complex vitamins tablet Take 1 tablet by mouth daily.      Beta Glucan 250 MG CAPS Take by mouth.     Cholecalciferol (VITAMIN D3) 125 MCG (5000 UT) CAPS Take 5,000 Units by mouth daily.     clopidogrel (PLAVIX) 75 MG tablet Take 75 mg by mouth daily.     Coenzyme Q10 400 MG CAPS Take 800 mg by mouth daily.      famotidine (PEPCID) 20 MG tablet Take 20 mg by mouth daily.     hydroxyurea (HYDREA) 500 MG capsule Take 1 capsule (500 mg total) by mouth daily. Take 1 capsule daily mon-Friday and 2 capsules sat-sunday 90 capsule 0   levothyroxine (SYNTHROID, LEVOTHROID) 75 MCG tablet Take 75 mcg by mouth daily before breakfast.     MAGNESIUM PO Take 1 tablet by mouth daily.     omega-3 acid ethyl esters (LOVAZA) 1 g capsule Take 1 g by mouth daily.      omeprazole (PRILOSEC) 20 MG capsule Take 20 mg by mouth daily before lunch.      OVER THE COUNTER MEDICATION Take 2 capsules by mouth daily. Bone Up     OVER THE COUNTER MEDICATION Take 1 tablet by mouth daily. ARTERIAL PROTECT     PREMARIN 0.45 MG tablet Take 0.45 mg by mouth daily.     Red Yeast Rice 600 MG CAPS Take 1,200 mg by mouth Nightly.     RESVERATROL PO Take by mouth daily.     Turmeric 500 MG CAPS Take 1,000 mg by mouth daily.     vitamin E 400 UNIT capsule Take 400 Units by mouth every other day.      No current facility-administered medications for this visit.     REVIEW OF SYSTEMS:   Constitutional: Denies fevers, chills or night sweats Eyes: Denies blurriness of vision Ears, nose, mouth, throat, and face: Denies mucositis or sore throat Respiratory: Denies cough, dyspnea or wheezes Cardiovascular: Denies palpitation, chest discomfort or lower extremity swelling Gastrointestinal:  heartburn  Skin: Denies abnormal skin rashes Lymphatics: Denies new lymphadenopathy or easy bruising Neurological:Denies numbness, tingling or new weaknesses Behavioral/Psych: Mood is stable, no new changes  All other systems were reviewed with the patient and are negative.  PHYSICAL EXAMINATION: ECOG  PERFORMANCE STATUS: 1 - Symptomatic but completely ambulatory  Vitals:   04/20/22 1533  BP: 135/72  Pulse: 66  Resp: 20  Temp: 98.9 F (37.2 C)  SpO2: 100%      Filed Weights   04/20/22 1533  Weight: 119 lb 11.2 oz (54.3 kg)       Exam deferred. No complaints  LABORATORY DATA:  I have reviewed the data as listed     Component Value Date/Time   NA 137 04/20/2022 1518   K 4.2 04/20/2022 1518   CL 101 04/20/2022 1518   CO2 29 04/20/2022 1518   GLUCOSE 92 04/20/2022 1518   BUN 23 04/20/2022 1518   CREATININE 0.99 04/20/2022 1518   CALCIUM 9.2 04/20/2022 1518   PROT 6.8 04/20/2022 1518   ALBUMIN 3.8 04/20/2022 1518   AST 19 04/20/2022 1518   ALT 14 04/20/2022 1518  ALKPHOS 41 04/20/2022 1518   BILITOT 0.4 04/20/2022 1518   GFRNONAA 58 (L) 04/20/2022 1518    No results found for: "SPEP", "UPEP"  Lab Results  Component Value Date   WBC 3.6 (L) 04/20/2022   NEUTROABS 1.6 (L) 04/20/2022   HGB 11.4 (L) 04/20/2022   HCT 34.5 (L) 04/20/2022   MCV 115.4 (H) 04/20/2022   PLT 307 04/20/2022      Chemistry      Component Value Date/Time   NA 137 04/20/2022 1518   K 4.2 04/20/2022 1518   CL 101 04/20/2022 1518   CO2 29 04/20/2022 1518   BUN 23 04/20/2022 1518   CREATININE 0.99 04/20/2022 1518      Component Value Date/Time   CALCIUM 9.2 04/20/2022 1518   ALKPHOS 41 04/20/2022 1518   AST 19 04/20/2022 1518   ALT 14 04/20/2022 1518   BILITOT 0.4 04/20/2022 1518       RADIOGRAPHIC STUDIES: I have personally reviewed the radiological images as listed and agreed with the findings in the report. No results found.

## 2022-05-18 ENCOUNTER — Inpatient Hospital Stay: Payer: Medicare Other | Attending: Internal Medicine

## 2022-05-18 ENCOUNTER — Encounter: Payer: Self-pay | Admitting: Internal Medicine

## 2022-05-18 ENCOUNTER — Inpatient Hospital Stay (HOSPITAL_BASED_OUTPATIENT_CLINIC_OR_DEPARTMENT_OTHER): Payer: Medicare Other | Admitting: Internal Medicine

## 2022-05-18 VITALS — BP 124/72 | HR 71 | Temp 96.1°F | Resp 17 | Wt 120.8 lb

## 2022-05-18 DIAGNOSIS — T451X5A Adverse effect of antineoplastic and immunosuppressive drugs, initial encounter: Secondary | ICD-10-CM | POA: Diagnosis not present

## 2022-05-18 DIAGNOSIS — D6481 Anemia due to antineoplastic chemotherapy: Secondary | ICD-10-CM | POA: Diagnosis not present

## 2022-05-18 DIAGNOSIS — D473 Essential (hemorrhagic) thrombocythemia: Secondary | ICD-10-CM | POA: Diagnosis not present

## 2022-05-18 DIAGNOSIS — D75839 Thrombocytosis, unspecified: Secondary | ICD-10-CM | POA: Insufficient documentation

## 2022-05-18 DIAGNOSIS — D649 Anemia, unspecified: Secondary | ICD-10-CM | POA: Diagnosis not present

## 2022-05-18 DIAGNOSIS — D702 Other drug-induced agranulocytosis: Secondary | ICD-10-CM

## 2022-05-18 LAB — CBC WITH DIFFERENTIAL/PLATELET
Abs Immature Granulocytes: 0 10*3/uL (ref 0.00–0.07)
Basophils Absolute: 0 10*3/uL (ref 0.0–0.1)
Basophils Relative: 1 %
Eosinophils Absolute: 0.1 10*3/uL (ref 0.0–0.5)
Eosinophils Relative: 2 %
HCT: 34.6 % — ABNORMAL LOW (ref 36.0–46.0)
Hemoglobin: 11.6 g/dL — ABNORMAL LOW (ref 12.0–15.0)
Immature Granulocytes: 0 %
Lymphocytes Relative: 36 %
Lymphs Abs: 1.5 10*3/uL (ref 0.7–4.0)
MCH: 38.7 pg — ABNORMAL HIGH (ref 26.0–34.0)
MCHC: 33.5 g/dL (ref 30.0–36.0)
MCV: 115.3 fL — ABNORMAL HIGH (ref 80.0–100.0)
Monocytes Absolute: 0.4 10*3/uL (ref 0.1–1.0)
Monocytes Relative: 9 %
Neutro Abs: 2.1 10*3/uL (ref 1.7–7.7)
Neutrophils Relative %: 52 %
Platelets: 277 10*3/uL (ref 150–400)
RBC: 3 MIL/uL — ABNORMAL LOW (ref 3.87–5.11)
RDW: 12 % (ref 11.5–15.5)
WBC: 4.1 10*3/uL (ref 4.0–10.5)
nRBC: 0 % (ref 0.0–0.2)

## 2022-05-18 NOTE — Progress Notes (Signed)
[place] OFFICE PROGRESS NOTE  CURRENT TREATMENT-  Hydroxyurea 500 mg on weekdays and 1000 mg on weekends.   ASSESSMENT & PLAN:  Cassandra Johns is a 82 yo F with pmh of GERD and TIA was referred to hematology clinic for further work-up of thrombocytosis.  #Essential thrombocytosis, JAK2 mutation, high risk  - Patient has elevated platelet count dating back to Aug 2019 and has been slowly trending up with most recent of 647 in July 2023. WBC and HB is normal. - Iron panel normal.  ESR CRP normal.  BCR-ABL by FISH negative.  LDH normal.  No splenomegaly on exam.  -Bone marrow biopsy from 10/21/2021 showed normocellular marrow, megakaryocytic atypia with hyperlobated nuclei and few hypolobated forms favoring ET. Cytogenetics showed normal karyotype.  -Patient is currently on hydroxyurea 500 mg daily.  CBC differential reviewed.  ANC is normalized.  Mild anemia with hemoglobin 11.6 which could be from hydroxyurea.  Platelets 277.  Goal is to keep below 400.  I will continue with the same dose.  - continue with Plavix for prior history of TIA.  # Occasional bright red blood  -Reports slight bright red blood on wiping.  Her last colonoscopy 2019 which showed hemorrhoids.  Will monitor for now.  Orders Placed This Encounter  Procedures   CBC with Differential ([facility] Only)    Standing Status:   Future    Standing Expiration Date:   [id number]   Basic Metabolic Panel - [facility] Only    Standing Status:   Future    Standing Expiration Date:   04/20/2023   Hepatic function panel    Standing Status:   Future    Standing Expiration Date:   05/18/2023   RTC in 3 months for MD visit, labs  The total time spent in the appointment was 30 minutes encounter with patients including review of chart and various tests results, discussions about plan of care and coordination of care plan   All questions were answered. The patient knows to call the clinic with any problems,  questions or concerns. No barriers to learning was detected.    Cassandra Canary, MD 3/26/20243:24 PM  INTERVAL HISTORY: Cassandra Johns 82 y.o. female returns for to discuss lab results for work up of thrombocytosis   SUMMARY OF HEMATOLOGIC HISTORY: Cassandra Johns is a 82 y.o. female with pmh of TIA and GERD  Patient was seen as initial visit on 09/25/2021 for work up of thrombocytosis. Patient denied fever, unexplained weight loss, shortness of breath, chest pain, lumps, change in appetite, headache, vision changes, pruritis.  Denies any history of blood clots. Reports episode of TIA in 2011.  Reports history of Raynaud's phenomenon 2-3 times a year.  Patient has elevated platelet count dating back to Aug 2019 and has been slowly trending up with most recent of 647 in July 2023. WBC and HB is normal. Further work-up showed normal iron studies and ESR/CRP. JAK2 mutation positive.  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  I have reviewed the past medical history, past surgical history, social history and family history with the patient and  they are unchanged from previous note.  INTERVAL HISTORY:  Patient seen today for toxicity check for hydroxyurea.  Patient is taking hydroxyurea 500 mg daily.  She is tolerating well.  Denies any nausea, oral ulcers or rash with it.  She reports fatigue. She reports occasional slight bright red blood on the toilet paper.  This been ongoing for about 5 months.  Her last colonoscopy was in 2019 which showed  hemorrhoids.   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  MEDICATIONS:  Current Outpatient Medications  Medication Sig Dispense Refill   acidophilus (RISAQUAD) CAPS capsule Take 1 capsule by mouth daily.     b complex vitamins tablet Take 1 tablet by mouth daily.     Beta Glucan 250 MG CAPS Take by mouth.     Cholecalciferol (VITAMIN D3) 125 MCG (5000 UT) CAPS Take 5,000 Units by mouth daily.     clopidogrel (PLAVIX) 75 MG tablet Take 75 mg by mouth daily.     Coenzyme Q10 400 MG CAPS Take 800 mg by mouth daily.      famotidine (PEPCID) 20 MG tablet Take 20 mg by mouth daily.     hydroxyurea (HYDREA) 500 MG capsule Take 500 mg by mouth daily.     levothyroxine (SYNTHROID, LEVOTHROID) 75 MCG tablet Take 75 mcg by mouth daily before breakfast.     MAGNESIUM PO Take 1 tablet by mouth daily.     omega-3 acid ethyl esters (LOVAZA) 1 g capsule Take 1 g by mouth daily.      omeprazole (PRILOSEC) 20 MG capsule Take 20 mg by mouth daily before lunch.      OVER THE COUNTER MEDICATION Take 2 capsules by mouth daily. Bone Up     OVER THE COUNTER MEDICATION Take 1 tablet by mouth daily. ARTERIAL PROTECT     PREMARIN 0.45 MG tablet Take 0.45 mg by mouth daily.     Red Yeast Rice 600 MG CAPS Take 1,200 mg by mouth Nightly.     RESVERATROL PO Take by mouth daily.     Turmeric 500 MG CAPS Take 1,000 mg by mouth daily.     vitamin E 400 UNIT capsule Take 400 Units by mouth every other day.      No current facility-administered medications for this visit.     REVIEW OF SYSTEMS:   Constitutional: Denies fevers, chills or night sweats Eyes: Denies blurriness of vision Ears, nose, mouth, throat, and face: Denies mucositis or sore throat Respiratory: Denies cough, dyspnea or wheezes Cardiovascular: Denies palpitation, chest discomfort or lower extremity swelling Gastrointestinal:  heartburn  Skin: Denies abnormal skin rashes Lymphatics: Denies new lymphadenopathy or easy  bruising Neurological:Denies numbness, tingling or new weaknesses Behavioral/Psych: Mood is stable, no new changes  All other systems were reviewed with the patient and are negative.  PHYSICAL EXAMINATION: ECOG PERFORMANCE STATUS: 1 - Symptomatic but completely ambulatory  Vitals:   05/18/22 1505  BP: 124/72  Pulse: 71  Resp: 17  Temp: (!) 96.1 F (35.6 C)  SpO2: 100%      Filed Weights   05/18/22 1505  Weight: 120 lb 12.8 oz (54.8 kg)       Exam deferred. No complaints  LABORATORY DATA:  I have reviewed the data as listed     Component Value Date/Time   NA 137 04/20/2022 1518   K 4.2 04/20/2022 1518   CL 101 04/20/2022 1518   CO2 29 04/20/2022 1518   GLUCOSE 92 04/20/2022 1518  BUN 23 04/20/2022 1518   CREATININE 0.99 04/20/2022 1518   CALCIUM 9.2 04/20/2022 1518   PROT 6.8 04/20/2022 1518   ALBUMIN 3.8 04/20/2022 1518   AST 19 04/20/2022 1518   ALT 14 04/20/2022 1518   ALKPHOS 41 04/20/2022 1518   BILITOT 0.4 04/20/2022 1518   GFRNONAA 58 (L) 04/20/2022 1518    No results found for: "SPEP", "UPEP"  Lab Results  Component Value Date   WBC 4.1 05/18/2022   NEUTROABS 2.1 05/18/2022   HGB 11.6 (L) 05/18/2022   HCT 34.6 (L) 05/18/2022   MCV 115.3 (H) 05/18/2022   PLT 277 05/18/2022      Chemistry      Component Value Date/Time   NA 137 04/20/2022 1518   K 4.2 04/20/2022 1518   CL 101 04/20/2022 1518   CO2 29 04/20/2022 1518   BUN 23 04/20/2022 1518   CREATININE 0.99 04/20/2022 1518      Component Value Date/Time   CALCIUM 9.2 04/20/2022 1518   ALKPHOS 41 04/20/2022 1518   AST 19 04/20/2022 1518   ALT 14 04/20/2022 1518   BILITOT 0.4 04/20/2022 1518       RADIOGRAPHIC STUDIES: I have personally reviewed the radiological images as listed and agreed with the findings in the report. No results found.

## 2022-05-18 NOTE — Progress Notes (Signed)
Appetite is good. Admits to some fatigue. Bowels normal. Denies any resp issues. Pt denoes ever having any blood clots. Taking hydrea 500 mg daily. No side effects.

## 2022-08-16 ENCOUNTER — Inpatient Hospital Stay (HOSPITAL_BASED_OUTPATIENT_CLINIC_OR_DEPARTMENT_OTHER): Payer: Medicare Other | Admitting: Internal Medicine

## 2022-08-16 ENCOUNTER — Inpatient Hospital Stay: Payer: Medicare Other | Attending: Internal Medicine

## 2022-08-16 ENCOUNTER — Encounter: Payer: Self-pay | Admitting: Internal Medicine

## 2022-08-16 VITALS — BP 129/77 | HR 71 | Temp 98.8°F | Resp 16 | Ht 63.0 in | Wt 119.6 lb

## 2022-08-16 DIAGNOSIS — D75839 Thrombocytosis, unspecified: Secondary | ICD-10-CM | POA: Insufficient documentation

## 2022-08-16 DIAGNOSIS — R5383 Other fatigue: Secondary | ICD-10-CM | POA: Insufficient documentation

## 2022-08-16 DIAGNOSIS — Z8673 Personal history of transient ischemic attack (TIA), and cerebral infarction without residual deficits: Secondary | ICD-10-CM | POA: Diagnosis not present

## 2022-08-16 DIAGNOSIS — D473 Essential (hemorrhagic) thrombocythemia: Secondary | ICD-10-CM

## 2022-08-16 LAB — BASIC METABOLIC PANEL - CANCER CENTER ONLY
Anion gap: 9 (ref 5–15)
BUN: 24 mg/dL — ABNORMAL HIGH (ref 8–23)
CO2: 28 mmol/L (ref 22–32)
Calcium: 9.4 mg/dL (ref 8.9–10.3)
Chloride: 99 mmol/L (ref 98–111)
Creatinine: 1.06 mg/dL — ABNORMAL HIGH (ref 0.44–1.00)
GFR, Estimated: 53 mL/min — ABNORMAL LOW (ref >60)
Glucose, Bld: 72 mg/dL (ref 70–99)
Potassium: 4 mmol/L (ref 3.5–5.1)
Sodium: 136 mmol/L (ref 135–145)

## 2022-08-16 LAB — CBC WITH DIFFERENTIAL (CANCER CENTER ONLY)
Abs Immature Granulocytes: 0.01 10*3/uL (ref 0.00–0.07)
Basophils Absolute: 0 10*3/uL (ref 0.0–0.1)
Basophils Relative: 1 %
Eosinophils Absolute: 0.1 10*3/uL (ref 0.0–0.5)
Eosinophils Relative: 3 %
HCT: 36.9 % (ref 36.0–46.0)
Hemoglobin: 12.3 g/dL (ref 12.0–15.0)
Immature Granulocytes: 0 %
Lymphocytes Relative: 28 %
Lymphs Abs: 1.1 10*3/uL (ref 0.7–4.0)
MCH: 37.8 pg — ABNORMAL HIGH (ref 26.0–34.0)
MCHC: 33.3 g/dL (ref 30.0–36.0)
MCV: 113.5 fL — ABNORMAL HIGH (ref 80.0–100.0)
Monocytes Absolute: 0.4 10*3/uL (ref 0.1–1.0)
Monocytes Relative: 9 %
Neutro Abs: 2.3 10*3/uL (ref 1.7–7.7)
Neutrophils Relative %: 59 %
Platelet Count: 247 10*3/uL (ref 150–400)
RBC: 3.25 MIL/uL — ABNORMAL LOW (ref 3.87–5.11)
RDW: 12.1 % (ref 11.5–15.5)
WBC Count: 3.8 10*3/uL — ABNORMAL LOW (ref 4.0–10.5)
nRBC: 0 % (ref 0.0–0.2)

## 2022-08-16 LAB — HEPATIC FUNCTION PANEL
ALT: 13 U/L (ref 0–44)
AST: 20 U/L (ref 15–41)
Albumin: 3.9 g/dL (ref 3.5–5.0)
Alkaline Phosphatase: 44 U/L (ref 38–126)
Bilirubin, Direct: <0.1 mg/dL (ref 0.0–0.2)
Total Bilirubin: 0.5 mg/dL (ref 0.3–1.2)
Total Protein: 7.1 g/dL (ref 6.5–8.1)

## 2022-08-16 NOTE — Patient Instructions (Signed)
Please take Hydroxyurea 500 mg once daily on the weekdays only.

## 2022-08-16 NOTE — Progress Notes (Signed)
[facility] OFFICE PROGRESS NOTE  CURRENT TREATMENT-  Hydroxyurea 500 mg on weekdays and 1000 mg on weekends.   ASSESSMENT & PLAN:  Ms Cassandra Johns is a 82 yo F with pmh of GERD and TIA was referred to hematology clinic for further work-up of thrombocytosis.  #Essential thrombocytosis, JAK2 mutation, high risk  - Patient has elevated platelet count dating back to Aug 2019 and has been slowly trending up with most recent of 647 in July 2023. WBC and HB is normal. - Iron panel normal.  ESR CRP normal.  BCR-ABL by FISH negative.  LDH normal.  No splenomegaly on exam.  -Bone marrow biopsy from 10/21/2021 showed normocellular marrow, megakaryocytic atypia with hyperlobated nuclei and few hypolobated forms favoring ET. Cytogenetics showed normal karyotype.  -Patient is currently taking hydroxyurea 500 mg daily.  CBC with differential showed WBC 3.8, ANC 2.3, hemoglobin normal and platelets of 247 with jis within the goal range.  However when patient has been feeling increasingly fatigue since starting the medication.  Her husband has Parkinson's disease and she is busy with her caregiving responsibilities.  But fatigue has worsened and does not feel has any quality of life.  I discussed with her about doing a trial of cutting down on the dose of hydroxyurea from 500 mg daily to 500 mg only on the weekdays and see if that helps with her energy level.  I will have to closely monitor the CBC during the change in dose.  RTC in 3 weeks for repeat labs.  - continue with Plavix for prior history of TIA.  # Occasional bright red blood  -Reports slight bright red blood on wiping.  Her last colonoscopy 2019 which showed hemorrhoids.  Will monitor for now.  Orders Placed This Encounter  Procedures   CBC with Differential/Platelet    Standing Status:   Future    Standing Expiration Date:   08/16/2023   RTC in 3 weeks for MD visit, lab  The total time spent in the appointment was 30 minutes  encounter with patients including review of chart and various tests results, discussions about plan of care and coordination of care plan   All questions were answered. The patient knows to call the clinic with any problems, questions or concerns. No barriers to learning was detected.    Michaelyn Barter, MD 6/24/20243:24 PM  INTERVAL HISTORY: Anecia K Cassandra Johns 82 y.o. female returns for to discuss lab results for work up of thrombocytosis   SUMMARY OF HEMATOLOGIC HISTORY: Cassandra Johns is a 82 y.o. female with pmh of TIA and GERD  Patient was seen as initial visit on 09/25/2021 for work up of thrombocytosis. Patient denied fever, unexplained weight loss, shortness of breath, chest pain, lumps, change in appetite, headache, vision changes, pruritis.  Denies any history of blood clots. Reports episode of TIA in 2011.  Reports history of Raynaud's phenomenon 2-3 times a year.  Patient has elevated platelet count dating back to Aug 2019 and has been slowly trending up with most recent of 647 in July 2023. WBC and HB is normal. Further work-up showed normal iron studies and ESR/CRP. JAK2 mutation positive.  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  I have reviewed the past medical history, past surgical history, social history and family history with the patient and they are unchanged from previous note.  INTERVAL HISTORY:  Patient seen today for toxicity check for hydroxyurea.  Patient is taking hydroxyurea 500 mg daily.  She is tolerating well.  She has on and  off oral ulcer but they resolved pretty quickly.  Her major concern today is the extreme fatigue she has been feeling recently.  She has always had fatigue and had to take an afternoon nap.  But now feels that the fatigue has significantly worsened and does not think she really has a quality of life.  She does not feel like doing any activity.  Her husband has Parkinson's and she has a lot of caregiving responsibility.   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  MEDICATIONS:  Current Outpatient Medications  Medication Sig Dispense Refill   acidophilus (RISAQUAD) CAPS capsule Take 1 capsule by mouth daily.     b complex vitamins tablet Take 1 tablet by mouth daily.     Beta Glucan 250 MG CAPS Take by mouth.     Cholecalciferol (VITAMIN D3) 125 MCG (5000 UT) CAPS Take 5,000 Units by mouth daily.     citalopram (CELEXA) 10 MG tablet Take 10 mg by mouth daily.     clopidogrel (PLAVIX) 75 MG tablet Take 75 mg by mouth daily.     Coenzyme Q10 400 MG CAPS Take 800 mg by mouth daily.      famotidine (PEPCID) 20 MG tablet Take 20 mg by mouth daily.     hydroxyurea (HYDREA) 500 MG capsule Take 500 mg by mouth daily.     levothyroxine (SYNTHROID, LEVOTHROID) 75 MCG tablet Take 75 mcg by mouth daily before breakfast.     magnesium oxide (MAG-OX) 400 MG tablet Take 400 mg by mouth daily.     MAGNESIUM PO Take 1 tablet by mouth daily.     omega-3 acid ethyl esters (LOVAZA) 1 g capsule Take 1 g by mouth daily.      omeprazole (PRILOSEC) 20 MG capsule Take 20 mg by mouth daily before lunch.      OVER THE COUNTER MEDICATION Take 2 capsules by mouth daily. Bone Up     OVER THE COUNTER MEDICATION Take 1 tablet by mouth daily. ARTERIAL PROTECT     PREMARIN 0.45 MG tablet Take 0.45 mg by mouth daily.     Red Yeast Rice 600 MG CAPS Take 1,200 mg by mouth Nightly.     RESVERATROL PO Take by mouth daily.     Turmeric 500 MG CAPS Take 1,000 mg by mouth daily.     vitamin E 400 UNIT capsule  Take 400 Units by mouth every other day.      No current facility-administered medications for this visit.     REVIEW OF SYSTEMS:   Constitutional: Denies fevers, chills or night sweats Eyes: Denies blurriness of vision Ears, nose, mouth, throat, and face: Denies mucositis or sore throat Respiratory: Denies cough, dyspnea or wheezes Cardiovascular: Denies palpitation, chest discomfort or lower extremity swelling Gastrointestinal:  heartburn  Skin: Denies abnormal skin rashes Lymphatics: Denies new lymphadenopathy or easy bruising Neurological:Denies numbness, tingling or new weaknesses Behavioral/Psych: Mood is stable, no new changes  All other systems were reviewed with the patient and are negative.  PHYSICAL EXAMINATION: ECOG PERFORMANCE STATUS: 1 - Symptomatic but  completely ambulatory  Vitals:   08/16/22 1031  BP: 129/77  Pulse: 71  Resp: 16  Temp: 98.8 F (37.1 C)  SpO2: 98%      Filed Weights   08/16/22 1031  Weight: 119 lb 9.6 oz (54.3 kg)       Exam deferred. No complaints  LABORATORY DATA:  I have reviewed the data as listed     Component Value Date/Time   NA 136 08/16/2022 1018   K 4.0 08/16/2022 1018   CL 99 08/16/2022 1018   CO2 28 08/16/2022 1018   GLUCOSE 72 08/16/2022 1018   BUN 24 (H) 08/16/2022 1018   CREATININE 1.06 (H) 08/16/2022 1018   CALCIUM 9.4 08/16/2022 1018   PROT 7.1 08/16/2022 1018   ALBUMIN 3.9 08/16/2022 1018   AST 20 08/16/2022 1018   ALT 13 08/16/2022 1018   ALKPHOS 44 08/16/2022 1018   BILITOT 0.5 08/16/2022 1018   GFRNONAA 53 (L) 08/16/2022 1018    No results found for: "SPEP", "UPEP"  Lab Results  Component Value Date   WBC 3.8 (L) 08/16/2022   NEUTROABS 2.3 08/16/2022   HGB 12.3 08/16/2022   HCT 36.9 08/16/2022   MCV 113.5 (H) 08/16/2022   PLT 247 08/16/2022      Chemistry      Component Value Date/Time   NA 136 08/16/2022 1018   K 4.0 08/16/2022 1018   CL 99 08/16/2022 1018   CO2 28 08/16/2022 1018    BUN 24 (H) 08/16/2022 1018   CREATININE 1.06 (H) 08/16/2022 1018      Component Value Date/Time   CALCIUM 9.4 08/16/2022 1018   ALKPHOS 44 08/16/2022 1018   AST 20 08/16/2022 1018   ALT 13 08/16/2022 1018   BILITOT 0.5 08/16/2022 1018       RADIOGRAPHIC STUDIES: I have personally reviewed the radiological images as listed and agreed with the findings in the report. No results found.

## 2022-09-03 ENCOUNTER — Inpatient Hospital Stay: Payer: Medicare Other | Attending: Internal Medicine

## 2022-09-03 ENCOUNTER — Inpatient Hospital Stay: Payer: Medicare Other | Admitting: Internal Medicine

## 2022-09-03 VITALS — BP 133/76 | HR 70 | Temp 97.7°F | Wt 120.4 lb

## 2022-09-03 DIAGNOSIS — D473 Essential (hemorrhagic) thrombocythemia: Secondary | ICD-10-CM

## 2022-09-03 DIAGNOSIS — D75839 Thrombocytosis, unspecified: Secondary | ICD-10-CM | POA: Insufficient documentation

## 2022-09-03 LAB — CBC WITH DIFFERENTIAL/PLATELET
Abs Immature Granulocytes: 0.01 10*3/uL (ref 0.00–0.07)
Basophils Absolute: 0 10*3/uL (ref 0.0–0.1)
Basophils Relative: 1 %
Eosinophils Absolute: 0.2 10*3/uL (ref 0.0–0.5)
Eosinophils Relative: 4 %
HCT: 35.1 % — ABNORMAL LOW (ref 36.0–46.0)
Hemoglobin: 11.5 g/dL — ABNORMAL LOW (ref 12.0–15.0)
Immature Granulocytes: 0 %
Lymphocytes Relative: 33 %
Lymphs Abs: 1.3 10*3/uL (ref 0.7–4.0)
MCH: 37.2 pg — ABNORMAL HIGH (ref 26.0–34.0)
MCHC: 32.8 g/dL (ref 30.0–36.0)
MCV: 113.6 fL — ABNORMAL HIGH (ref 80.0–100.0)
Monocytes Absolute: 0.4 10*3/uL (ref 0.1–1.0)
Monocytes Relative: 10 %
Neutro Abs: 2.1 10*3/uL (ref 1.7–7.7)
Neutrophils Relative %: 52 %
Platelets: 239 10*3/uL (ref 150–400)
RBC: 3.09 MIL/uL — ABNORMAL LOW (ref 3.87–5.11)
RDW: 12.1 % (ref 11.5–15.5)
WBC: 4 10*3/uL (ref 4.0–10.5)
nRBC: 0 % (ref 0.0–0.2)

## 2022-09-03 NOTE — Progress Notes (Signed)
[facility] OFFICE PROGRESS NOTE  CURRENT TREATMENT-  Hydroxyurea 500 mg on weekdays and 1000 mg on weekends.   ASSESSMENT & PLAN:  Cassandra Johns is a 82 yo F with pmh of GERD and TIA was referred to hematology clinic for further work-up of thrombocytosis.  #Essential thrombocytosis, JAK2 mutation, high risk  - Patient has elevated platelet count dating back to Aug 2019 and has been slowly trending up with most recent of 647 in July 2023. WBC and HB is normal. - Iron panel normal.  ESR CRP normal.  BCR-ABL by FISH negative.  LDH normal.  No splenomegaly on exam.  -Bone marrow biopsy from 10/21/2021 showed normocellular marrow, megakaryocytic atypia with hyperlobated nuclei and few hypolobated forms favoring ET. Cytogenetics showed normal karyotype.  -Last visit, hydroxyurea was decreased from 500 mg daily to on weekdays only because of worsening fatigue.  There was concern may be a component of hydroxyurea was there.  However patient reports that prior to cutting down on the dose, her husband has been starting to sleep better who has Parkinson's disease as a result she is also able to rest well and does not feel as much fatigue.  But she has made the dose changes as we had discussed.  Labs reviewed today and her platelets continue to be within normal at 239.  WBC and ANC is normal.  Mild anemia noted.  She will come for labs only in 2 months and I will follow-up with her in 4 months.  - continue with Plavix for prior history of TIA.  # Occasional bright red blood  -Reports slight bright red blood on wiping.  Her last colonoscopy 2019 which showed hemorrhoids.  Will monitor for now.  Orders Placed This Encounter  Procedures   CBC with Differential/Platelet    Standing Status:   Future    Standing Expiration Date:   09/03/2023   Comprehensive metabolic panel    Standing Status:   Future    Standing Expiration Date:   09/03/2023   CBC with Differential/Platelet    Standing  Status:   Future    Standing Expiration Date:   09/03/2023   RTC in 3 weeks for MD visit, lab  The total time spent in the appointment was 30 minutes encounter with patients including review of chart and various tests results, discussions about plan of care and coordination of care plan   All questions were answered. The patient knows to call the clinic with any problems, questions or concerns. No barriers to learning was detected.    Michaelyn Barter, MD 7/12/20242:33 PM  INTERVAL HISTORY: Cassandra Johns 82 y.o. female returns for to discuss lab results for work up of thrombocytosis   SUMMARY OF HEMATOLOGIC HISTORY: Cassandra Johns is a 82 y.o. female with pmh of TIA and GERD  Patient was seen as initial visit on 09/25/2021 for work up of thrombocytosis. Patient denied fever, unexplained weight loss, shortness of breath, chest pain, lumps, change in appetite, headache, vision changes, pruritis.  Denies any history of blood clots. Reports episode of TIA in 2011.  Reports history of Raynaud's phenomenon 2-3 times a year.  Patient has elevated platelet count dating back to Aug 2019 and has been slowly trending up with most recent of 647 in July 2023. WBC and HB is normal. Further work-up showed normal iron studies and ESR/CRP. JAK2 mutation positive.  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  I have reviewed the past medical history, past surgical history, social history and family history with the patient and they are unchanged from previous note.  INTERVAL  HISTORY:  Patient seen today for toxicity check for hydroxyurea.  Patient has decreased the dose of hydroxyurea from 500 mg daily to on the weekdays.  Prior to she made the change, her husband who has Parkinson's disease had been sleeping better at night and she is also been sleeping better overall which has significantly improved her fatigue level.  But she made the change to the dose as we had discussed because of increasing fatigue.  Reports some dizzy sensation when she wakes up in the morning.  So occasional.  Not associated with any headaches or changing vision.  We discussed about monitoring for now.   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  MEDICATIONS:  Current Outpatient Medications  Medication Sig Dispense Refill   acidophilus (RISAQUAD) CAPS capsule Take 1 capsule by mouth daily.     b complex vitamins tablet Take 1 tablet by mouth daily.     Beta Glucan 250 MG CAPS Take by mouth.     Cholecalciferol (VITAMIN D3) 125 MCG (5000 UT) CAPS Take 5,000 Units by mouth daily.     citalopram (CELEXA) 10 MG tablet Take 10 mg by mouth daily.     clopidogrel (PLAVIX) 75 MG tablet Take 75 mg by mouth daily.     Coenzyme Q10 400 MG CAPS Take 800 mg by mouth daily.      famotidine (PEPCID) 20 MG tablet Take 20 mg by mouth daily.     hydroxyurea (HYDREA) 500 MG capsule Take 500 mg by mouth daily.     levothyroxine (SYNTHROID, LEVOTHROID) 75 MCG tablet Take 75 mcg by mouth daily before breakfast.     magnesium oxide (MAG-OX) 400 MG tablet Take 400 mg by mouth daily.     MAGNESIUM PO Take 1 tablet by mouth daily.     omega-3 acid ethyl esters (LOVAZA) 1 g capsule Take 1 g by mouth daily.      omeprazole (PRILOSEC) 20 MG capsule Take 20 mg by mouth daily before lunch.      OVER THE COUNTER MEDICATION Take 2 capsules by mouth daily. Bone Up     OVER THE COUNTER MEDICATION Take 1 tablet by mouth daily. ARTERIAL PROTECT     PREMARIN 0.45 MG tablet Take 0.45 mg by mouth  daily.     Red Yeast Rice 600 MG CAPS Take 1,200 mg by mouth Nightly.     RESVERATROL PO Take by mouth daily.     Turmeric 500 MG CAPS Take 1,000 mg by mouth daily.     vitamin E 400 UNIT capsule Take 400 Units by mouth every other day.      No current facility-administered medications for this visit.     REVIEW OF SYSTEMS:   Constitutional: Denies fevers, chills or night sweats Eyes: Denies blurriness of vision Ears, nose, mouth, throat, and face: Denies mucositis or sore throat Respiratory: Denies cough, dyspnea or wheezes Cardiovascular: Denies palpitation, chest discomfort or lower extremity swelling Gastrointestinal:  heartburn  Skin: Denies abnormal skin rashes Lymphatics:  Denies new lymphadenopathy or easy bruising Neurological:Denies numbness, tingling or new weaknesses Behavioral/Psych: Mood is stable, no new changes  All other systems were reviewed with the patient and are negative.  PHYSICAL EXAMINATION: ECOG PERFORMANCE STATUS: 1 - Symptomatic but completely ambulatory  Vitals:   09/03/22 1331  BP: 133/76  Pulse: 70  Temp: 97.7 F (36.5 C)  SpO2: 99%      Filed Weights   09/03/22 1331  Weight: 120 lb 6.4 oz (54.6 kg)       Exam deferred. No complaints  LABORATORY DATA:  I have reviewed the data as listed     Component Value Date/Time   NA 136 08/16/2022 1018   K 4.0 08/16/2022 1018   CL 99 08/16/2022 1018   CO2 28 08/16/2022 1018   GLUCOSE 72 08/16/2022 1018   BUN 24 (H) 08/16/2022 1018   CREATININE 1.06 (H) 08/16/2022 1018   CALCIUM 9.4 08/16/2022 1018   PROT 7.1 08/16/2022 1018   ALBUMIN 3.9 08/16/2022 1018   AST 20 08/16/2022 1018   ALT 13 08/16/2022 1018   ALKPHOS 44 08/16/2022 1018   BILITOT 0.5 08/16/2022 1018   GFRNONAA 53 (L) 08/16/2022 1018    No results found for: "SPEP", "UPEP"  Lab Results  Component Value Date   WBC 4.0 09/03/2022   NEUTROABS 2.1 09/03/2022   HGB 11.5 (L) 09/03/2022   HCT 35.1 (L) 09/03/2022   MCV  113.6 (H) 09/03/2022   PLT 239 09/03/2022      Chemistry      Component Value Date/Time   NA 136 08/16/2022 1018   K 4.0 08/16/2022 1018   CL 99 08/16/2022 1018   CO2 28 08/16/2022 1018   BUN 24 (H) 08/16/2022 1018   CREATININE 1.06 (H) 08/16/2022 1018      Component Value Date/Time   CALCIUM 9.4 08/16/2022 1018   ALKPHOS 44 08/16/2022 1018   AST 20 08/16/2022 1018   ALT 13 08/16/2022 1018   BILITOT 0.5 08/16/2022 1018       RADIOGRAPHIC STUDIES: I have personally reviewed the radiological images as listed and agreed with the findings in the report. No results found.

## 2022-09-03 NOTE — Progress Notes (Signed)
Patient is still having some fuzzy feeling in her head in the mornings after she wakes up.

## 2022-11-04 ENCOUNTER — Inpatient Hospital Stay: Payer: Medicare Other | Attending: Internal Medicine

## 2022-11-04 DIAGNOSIS — D473 Essential (hemorrhagic) thrombocythemia: Secondary | ICD-10-CM | POA: Diagnosis present

## 2022-11-04 LAB — COMPREHENSIVE METABOLIC PANEL
ALT: 13 U/L (ref 0–44)
AST: 19 U/L (ref 15–41)
Albumin: 3.9 g/dL (ref 3.5–5.0)
Alkaline Phosphatase: 40 U/L (ref 38–126)
Anion gap: 9 (ref 5–15)
BUN: 20 mg/dL (ref 8–23)
CO2: 27 mmol/L (ref 22–32)
Calcium: 9.2 mg/dL (ref 8.9–10.3)
Chloride: 99 mmol/L (ref 98–111)
Creatinine, Ser: 0.98 mg/dL (ref 0.44–1.00)
GFR, Estimated: 58 mL/min — ABNORMAL LOW (ref >60)
Glucose, Bld: 85 mg/dL (ref 70–99)
Potassium: 3.9 mmol/L (ref 3.5–5.1)
Sodium: 135 mmol/L (ref 135–145)
Total Bilirubin: 0.4 mg/dL (ref 0.3–1.2)
Total Protein: 6.9 g/dL (ref 6.5–8.1)

## 2022-11-04 LAB — CBC WITH DIFFERENTIAL/PLATELET
Abs Immature Granulocytes: 0.01 10*3/uL (ref 0.00–0.07)
Basophils Absolute: 0 10*3/uL (ref 0.0–0.1)
Basophils Relative: 1 %
Eosinophils Absolute: 0.1 10*3/uL (ref 0.0–0.5)
Eosinophils Relative: 2 %
HCT: 35.2 % — ABNORMAL LOW (ref 36.0–46.0)
Hemoglobin: 11.6 g/dL — ABNORMAL LOW (ref 12.0–15.0)
Immature Granulocytes: 0 %
Lymphocytes Relative: 34 %
Lymphs Abs: 1.4 10*3/uL (ref 0.7–4.0)
MCH: 36.9 pg — ABNORMAL HIGH (ref 26.0–34.0)
MCHC: 33 g/dL (ref 30.0–36.0)
MCV: 112.1 fL — ABNORMAL HIGH (ref 80.0–100.0)
Monocytes Absolute: 0.4 10*3/uL (ref 0.1–1.0)
Monocytes Relative: 9 %
Neutro Abs: 2.3 10*3/uL (ref 1.7–7.7)
Neutrophils Relative %: 54 %
Platelets: 251 10*3/uL (ref 150–400)
RBC: 3.14 MIL/uL — ABNORMAL LOW (ref 3.87–5.11)
RDW: 12 % (ref 11.5–15.5)
WBC: 4.3 10*3/uL (ref 4.0–10.5)
nRBC: 0 % (ref 0.0–0.2)

## 2022-11-17 ENCOUNTER — Other Ambulatory Visit: Payer: Self-pay | Admitting: Internal Medicine

## 2022-12-20 ENCOUNTER — Encounter: Payer: Self-pay | Admitting: Internal Medicine

## 2022-12-21 ENCOUNTER — Other Ambulatory Visit: Payer: Self-pay | Admitting: *Deleted

## 2022-12-21 ENCOUNTER — Inpatient Hospital Stay (HOSPITAL_BASED_OUTPATIENT_CLINIC_OR_DEPARTMENT_OTHER): Payer: Medicare Other | Admitting: Hospice and Palliative Medicine

## 2022-12-21 ENCOUNTER — Other Ambulatory Visit: Payer: Self-pay

## 2022-12-21 ENCOUNTER — Encounter: Payer: Self-pay | Admitting: Hospice and Palliative Medicine

## 2022-12-21 ENCOUNTER — Telehealth: Payer: Self-pay | Admitting: *Deleted

## 2022-12-21 ENCOUNTER — Inpatient Hospital Stay: Payer: Medicare Other | Attending: Internal Medicine

## 2022-12-21 ENCOUNTER — Encounter: Payer: Self-pay | Admitting: Internal Medicine

## 2022-12-21 VITALS — BP 136/71 | HR 71 | Temp 97.8°F | Resp 20 | Ht 63.0 in | Wt 121.4 lb

## 2022-12-21 DIAGNOSIS — D473 Essential (hemorrhagic) thrombocythemia: Secondary | ICD-10-CM

## 2022-12-21 DIAGNOSIS — L819 Disorder of pigmentation, unspecified: Secondary | ICD-10-CM | POA: Diagnosis not present

## 2022-12-21 LAB — CBC WITH DIFFERENTIAL (CANCER CENTER ONLY)
Abs Immature Granulocytes: 0.01 10*3/uL (ref 0.00–0.07)
Basophils Absolute: 0 10*3/uL (ref 0.0–0.1)
Basophils Relative: 1 %
Eosinophils Absolute: 0.1 10*3/uL (ref 0.0–0.5)
Eosinophils Relative: 2 %
HCT: 36.8 % (ref 36.0–46.0)
Hemoglobin: 12 g/dL (ref 12.0–15.0)
Immature Granulocytes: 0 %
Lymphocytes Relative: 28 %
Lymphs Abs: 1.2 10*3/uL (ref 0.7–4.0)
MCH: 36 pg — ABNORMAL HIGH (ref 26.0–34.0)
MCHC: 32.6 g/dL (ref 30.0–36.0)
MCV: 110.5 fL — ABNORMAL HIGH (ref 80.0–100.0)
Monocytes Absolute: 0.5 10*3/uL (ref 0.1–1.0)
Monocytes Relative: 11 %
Neutro Abs: 2.5 10*3/uL (ref 1.7–7.7)
Neutrophils Relative %: 58 %
Platelet Count: 275 10*3/uL (ref 150–400)
RBC: 3.33 MIL/uL — ABNORMAL LOW (ref 3.87–5.11)
RDW: 12.5 % (ref 11.5–15.5)
WBC Count: 4.3 10*3/uL (ref 4.0–10.5)
nRBC: 0 % (ref 0.0–0.2)

## 2022-12-21 NOTE — Telephone Encounter (Signed)
Contacted patient to offer smc apt today. Pt can arrive at 230 for labs and see josh s/p labs. Patient has a h/o essential thrombocythemia and has noted numbness in right middle finger and blue discoloration. Pt currently on hydrea.

## 2022-12-21 NOTE — Progress Notes (Signed)
Sympt[facility] [phone] [phone]  Patient Care Team: Kandyce Rud, MD as PCP - General (Family Medicine)   NAME OF PATIENT: Cassandra Johns  [id number]  [birth year]   DATE OF VISIT: 12/21/22  REASON FOR CONSULT: Cassandra Johns is a 82 y.o. female with multiple medical problems including history of TIA on Plavix, with JAK2 mutation essential thrombocytosis.   INTERVAL HISTORY: Patient last saw Dr. Alena Bills 09/03/2022.  Patient is managed on hydroxyurea.  Patient presents to [facility] today for evaluation of a discolored finger.  Patient reports 24 to 48 hours of a purple appearing right middle finger.  She says that she noticed it after taking a shower.  Denies any injury or pain.  She says that the finger was numb yesterday but that has been improving today.  Denies any other rashes, bleeding, or bruising.  Denies any neurologic complaints. Denies recent fevers or illnesses. Denies any easy bleeding or bruising. Reports good appetite and denies weight loss. Denies chest pain. Denies any nausea, vomiting, constipation, or diarrhea. Denies urinary complaints. Patient offers no further specific complaints today.   PAST MEDICAL HISTORY: Past Medical History:  Diagnosis Date   Chronic constipation    Diverticulosis    Esophagitis    GERD (gastroesophageal reflux disease)    History of colon polyps    Hyperlipidemia    Hypertension    h/o bp now controlled   Hypothyroidism    Stroke (HCC)     TIA-2011   UTI (urinary tract infection)     PAST SURGICAL HISTORY:  Past Surgical History:  Procedure Laterality Date   ABDOMINAL HYSTERECTOMY     WITH BSO   BACK SURGERY     CERVICAL LAMINECTOMY   BROW LIFT Bilateral 08/08/2020   Procedure: BROW PTOSIS REPAIR BILATERAL;  Surgeon: Imagene Riches, MD;  Location: [facility];  Service: Ophthalmology;  Laterality: Bilateral;   CERVICAL LAMINECTOMY     X  2   COLONOSCOPY     COLONOSCOPY WITH PROPOFOL N/A 02/28/2017   Procedure: COLONOSCOPY WITH PROPOFOL;  Surgeon: Christena Deem, MD;  Location: [facility];  Service: Endoscopy;  Laterality: N/A;   CYSTOCELE REPAIR N/A 12/14/2019   Procedure: ANTERIOR REPAIR (CYSTOCELE);  Surgeon: Ward, Elenora Fender, MD;  Location: [facility] OR[facility];  Service: Gynecology;  Laterality: N/A;   ESOPHAGOGASTRODUODENOSCOPY      HEMATOLOGY/ONCOLOGY HISTORY:  Oncology History   No history exists.    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  MEDICATIONS:  Current Outpatient Medications  Medication Sig Dispense Refill   acidophilus (RISAQUAD) CAPS capsule Take 1 capsule by mouth daily.     b complex vitamins tablet Take 1 tablet by mouth daily.     Beta Glucan 250 MG CAPS Take by mouth.     Cholecalciferol (VITAMIN D3) 125 MCG (5000 UT) CAPS Take 5,000 Units by mouth daily.     citalopram (CELEXA) 10 MG tablet Take 10 mg by mouth daily.     clopidogrel (PLAVIX) 75 MG tablet Take 75 mg by mouth daily.     Coenzyme Q10 400 MG CAPS Take 800 mg by mouth daily.      famotidine (PEPCID) 20 MG tablet Take 20 mg by mouth daily.     hydroxyurea (HYDREA) 500 MG capsule TAKE 1 CAPSULE BY MOUTH ONCE DAILY WITH FOOD TO MINIMIZE GI SIDE EFFECTS 90 capsule 0   levothyroxine (SYNTHROID, LEVOTHROID) 75 MCG tablet Take  75 mcg by mouth daily before breakfast.     magnesium oxide (MAG-OX) 400 MG tablet Take 400 mg by mouth daily.     MAGNESIUM PO Take 1 tablet by mouth daily.     omega-3 acid ethyl esters (LOVAZA) 1 g capsule Take 1 g by mouth daily.      omeprazole (PRILOSEC) 20 MG capsule Take 20 mg by mouth daily before lunch.      OVER THE COUNTER MEDICATION Take 2 capsules by mouth daily. Bone Up     OVER THE COUNTER MEDICATION Take 1 tablet by mouth daily. ARTERIAL PROTECT     PREMARIN 0.45 MG tablet Take 0.45 mg by mouth daily.     Red Yeast Rice 600 MG CAPS Take 1,200 mg by mouth Nightly.      RESVERATROL PO Take by mouth daily.     Turmeric 500 MG CAPS Take 1,000 mg by mouth daily.     vitamin E 400 UNIT capsule Take 400 Units by mouth every other day.      No current facility-administered medications for this visit.    VITAL SIGNS: There were no vitals taken for this visit. There were no vitals filed for this visit.  Estimated body mass index is 21.33 kg/m as calculated from the following:   Height as of 08/16/22: 5\' 3"  (1.6 m).   Weight as of 09/03/22: 120 lb 6.4 oz (54.6 kg).  LABS: CBC:    Component Value Date/Time   WBC 4.3 11/04/2022 1405   HGB 11.6 (L) 11/04/2022 1405   HGB 12.3 08/16/2022 1018   HCT 35.2 (L) 11/04/2022 1405   PLT 251 11/04/2022 1405   PLT 247 08/16/2022 1018   MCV 112.1 (H) 11/04/2022 1405   NEUTROABS 2.3 11/04/2022 1405   LYMPHSABS 1.4 11/04/2022 1405   MONOABS 0.4 11/04/2022 1405   EOSABS 0.1 11/04/2022 1405   BASOSABS 0.0 11/04/2022 1405   Comprehensive Metabolic Panel:    Component Value Date/Time   NA 135 11/04/2022 1405   K 3.9 11/04/2022 1405   CL 99 11/04/2022 1405   CO2 27 11/04/2022 1405   BUN 20 11/04/2022 1405   CREATININE 0.98 11/04/2022 1405   CREATININE 1.06 (H) 08/16/2022 1018   GLUCOSE 85 11/04/2022 1405   CALCIUM 9.2 11/04/2022 1405   AST 19 11/04/2022 1405   ALT 13 11/04/2022 1405   ALKPHOS 40 11/04/2022 1405   BILITOT 0.4 11/04/2022 1405   PROT 6.9 11/04/2022 1405   ALBUMIN 3.9 11/04/2022 1405    RADIOGRAPHIC STUDIES: No results found.  PERFORMANCE STATUS (ECOG) : 1 - Symptomatic but completely ambulatory  Review of Systems Unless otherwise noted, a complete review of systems is negative.  Physical Exam General: NAD Cardiovascular: regular rate and rhythm Pulmonary: clear ant fields Abdomen: soft, nontender, + bowel sounds GU: no suprapubic tenderness Extremities: no edema, discolored right middle finger with sluggish cap refill, intact radial pulses bilaterally Skin: no rashes Neurological:  nonfocal       IMPRESSION/PLAN: Essential thrombocytosis -discussed with Dr. Alena Bills and patient advised to hold hydroxyurea until patient is seen on 11/12  Discolored finger -sluggish capillary refill.  Positive radial pulses.  Unclear etiology.  Discussed with Dr. Alena Bills and patient advised to hold hydroxyurea until patient returns to clinic on 11/12.  Referral sent to vascular for further evaluation and management.   Patient expressed understanding and was in agreement with this plan. She also understands that She can call clinic at any time with any questions,  concerns, or complaints.   Thank you for allowing me to participate in the care of this very pleasant patient.   Time Total: 15 minutes  Visit consisted of counseling and education dealing with the complex and emotionally intense issues of symptom management in the setting of serious illness.Greater than 50%  of this time was spent counseling and coordinating care related to the above assessment and plan.  Signed by: Laurette Schimke, PhD, NP-C

## 2022-12-22 ENCOUNTER — Encounter: Payer: Self-pay | Admitting: *Deleted

## 2023-01-04 ENCOUNTER — Inpatient Hospital Stay: Payer: Medicare Other | Attending: Internal Medicine

## 2023-01-04 ENCOUNTER — Inpatient Hospital Stay (HOSPITAL_BASED_OUTPATIENT_CLINIC_OR_DEPARTMENT_OTHER): Payer: Medicare Other | Admitting: Internal Medicine

## 2023-01-04 VITALS — BP 111/82 | HR 71 | Temp 98.7°F | Wt 123.0 lb

## 2023-01-04 DIAGNOSIS — D473 Essential (hemorrhagic) thrombocythemia: Secondary | ICD-10-CM | POA: Insufficient documentation

## 2023-01-04 DIAGNOSIS — R230 Cyanosis: Secondary | ICD-10-CM | POA: Diagnosis not present

## 2023-01-04 LAB — CBC WITH DIFFERENTIAL/PLATELET
Abs Immature Granulocytes: 0.01 10*3/uL (ref 0.00–0.07)
Basophils Absolute: 0 10*3/uL (ref 0.0–0.1)
Basophils Relative: 1 %
Eosinophils Absolute: 0.1 10*3/uL (ref 0.0–0.5)
Eosinophils Relative: 2 %
HCT: 35.5 % — ABNORMAL LOW (ref 36.0–46.0)
Hemoglobin: 12 g/dL (ref 12.0–15.0)
Immature Granulocytes: 0 %
Lymphocytes Relative: 32 %
Lymphs Abs: 1.5 10*3/uL (ref 0.7–4.0)
MCH: 36.9 pg — ABNORMAL HIGH (ref 26.0–34.0)
MCHC: 33.8 g/dL (ref 30.0–36.0)
MCV: 109.2 fL — ABNORMAL HIGH (ref 80.0–100.0)
Monocytes Absolute: 0.6 10*3/uL (ref 0.1–1.0)
Monocytes Relative: 12 %
Neutro Abs: 2.4 10*3/uL (ref 1.7–7.7)
Neutrophils Relative %: 53 %
Platelets: 298 10*3/uL (ref 150–400)
RBC: 3.25 MIL/uL — ABNORMAL LOW (ref 3.87–5.11)
RDW: 12.6 % (ref 11.5–15.5)
WBC: 4.6 10*3/uL (ref 4.0–10.5)
nRBC: 0 % (ref 0.0–0.2)

## 2023-01-04 NOTE — Progress Notes (Signed)
[facility] OFFICE PROGRESS NOTE  CURRENT TREATMENT-  Hydroxyurea 500 mg on weekdays and 1000 mg on weekends.   ASSESSMENT & PLAN:  Cassandra Johns is a 82 yo F with pmh of GERD and TIA was referred to hematology clinic for further work-up of thrombocytosis.  #Essential thrombocytosis, JAK2 mutation, high risk  - Patient has elevated platelet count dating back to Aug 2019 and has been slowly trending up with most recent of 647 in July 2023. WBC and HB is normal. - Iron panel normal.  ESR CRP normal.  BCR-ABL by FISH negative.  LDH normal.  No splenomegaly on exam.  -Bone marrow biopsy from 10/21/2021 showed normocellular marrow, megakaryocytic atypia with hyperlobated nuclei and few hypolobated forms favoring ET. Cytogenetics showed normal karyotype.  -Patient was seen in [facility] couple of weeks ago for cyanosis of right hand.  Her platelets has been in good control.  Is unclear what may have triggered the cyanosis.  She is scheduled for vascular consultation on December 6.  She is on Plavix.  We advised her to add aspirin 81 mg also.  Hydroxyurea held on 12/21/2022.  -Today, cyanosis has completely resolved.  She reports it took about a week.  Numbness has also resolved.  Platelets today are 298.  I will continue to hold hydroxyurea for now until she has vascular evaluation.  Will do platelet check in 2 weeks.  I will follow-up with her in 4 weeks.    Orders Placed This Encounter  Procedures   CBC with Differential (Cancer Center Only)    Standing Status:   Future    Standing Expiration Date:   01/04/2024   CBC with Differential (Cancer Center Only)    Standing Status:   Future    Standing Expiration Date:   01/04/2024   CMP (Cancer Center only)    Standing Status:   Future    Standing Expiration Date:   01/04/2024   2 weeks labs only 4 weeks MD visit, labs  The total time spent in the appointment was 30 minutes encounter with patients including review of chart and various  tests results, discussions about plan of care and coordination of care plan   All questions were answered. The patient knows to call the clinic with any problems, questions or concerns. No barriers to learning was detected.    Michaelyn Barter, MD 11/12/20241:50 PM  INTERVAL HISTORY: Cassandra Johns 82 y.o. female returns for to discuss lab results for work up of thrombocytosis   SUMMARY OF HEMATOLOGIC HISTORY: Cassandra Johns is a 82 y.o. female with pmh of TIA and GERD  Patient was seen as initial visit on 09/25/2021 for work up of thrombocytosis. Patient denied fever, unexplained weight loss, shortness of breath, chest pain, lumps, change in appetite, headache, vision changes, pruritis.  Denies any history of blood clots. Reports episode of TIA in 2011.  Reports history of Raynaud's phenomenon 2-3 times a year.  Patient has elevated platelet count dating back to Aug 2019 and has been slowly trending up with most recent of 647 in July 2023. WBC and HB is normal. Further work-up showed normal iron studies and ESR/CRP. JAK2 mutation positive.  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  I have reviewed the past medical history, past surgical history, social history and family history with the patient and they are unchanged from previous note.  INTERVAL HISTORY:  Patient seen today for toxicity check for hydroxyurea. She has been holding hydroxyurea for the past couple of weeks.  She developed cyanosis of her right hand.  With the numbness at the end of the  fingertip.  Her symptoms lasted for about a week and has spontaneously resolved.  She has added baby aspirin.  Otherwise denies any other concerns.  She has been doing well overall.  Her son comes to help with her husband so she has some time for herself.  Has been sleeping well at night.  Energy level is good.    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  MEDICATIONS:  Current Outpatient Medications  Medication Sig Dispense Refill   acidophilus (RISAQUAD) CAPS capsule Take 1 capsule by mouth daily.     b complex vitamins tablet Take 1 tablet by mouth daily.     Beta Glucan 250 MG CAPS Take by mouth.     Cholecalciferol (VITAMIN D3) 125 MCG (5000 UT) CAPS Take 5,000 Units by mouth daily.     citalopram (CELEXA) 10 MG tablet Take 10 mg by mouth daily.     clopidogrel (PLAVIX) 75 MG tablet Take 75 mg by mouth daily.     Coenzyme Q10 400 MG CAPS Take 800 mg by mouth daily.      famotidine (PEPCID) 20 MG tablet Take 20 mg by mouth daily.     hydroxyurea (HYDREA) 500 MG capsule TAKE 1 CAPSULE BY MOUTH ONCE DAILY WITH FOOD TO MINIMIZE GI SIDE EFFECTS 90 capsule 0   levothyroxine (SYNTHROID, LEVOTHROID) 75 MCG tablet Take 75 mcg by mouth daily before breakfast.     magnesium oxide (MAG-OX) 400 MG tablet Take 400 mg by mouth daily.     MAGNESIUM PO Take 1 tablet by mouth daily.     omega-3 acid ethyl esters (LOVAZA) 1 g capsule Take 1 g by mouth daily.      omeprazole (PRILOSEC) 20 MG capsule Take 20 mg by mouth daily before lunch.      OVER THE COUNTER MEDICATION Take 2 capsules by mouth daily. Bone Up     OVER THE COUNTER MEDICATION Take 1 tablet by mouth daily. ARTERIAL PROTECT     PREMARIN 0.45 MG tablet Take 0.45 mg by mouth daily.     Red Yeast Rice 600 MG CAPS Take 1,200 mg by mouth Nightly.     RESVERATROL PO Take by mouth daily.     Turmeric 500 MG CAPS Take 1,000 mg by mouth daily.     vitamin E 400 UNIT capsule Take 400 Units by mouth every other day.      No  current facility-administered medications for this visit.     REVIEW OF SYSTEMS:   Constitutional: Denies fevers, chills or night sweats Eyes: Denies blurriness of vision Ears, nose, mouth, throat, and face: Denies mucositis or sore throat Respiratory: Denies cough, dyspnea or wheezes Cardiovascular: Denies palpitation, chest discomfort or lower extremity swelling Gastrointestinal:  heartburn  Skin: Denies abnormal skin rashes Lymphatics: Denies new lymphadenopathy or easy bruising Neurological:Denies numbness, tingling or new weaknesses Behavioral/Psych: Mood is stable, no new changes  All other systems were reviewed with the patient and are negative.  PHYSICAL EXAMINATION: ECOG  PERFORMANCE STATUS: 1 - Symptomatic but completely ambulatory  Vitals:   01/04/23 1321  BP: 111/82  Pulse: 71  Temp: 98.7 F (37.1 C)  SpO2: 100%      Filed Weights   01/04/23 1321  Weight: 123 lb (55.8 kg)       Exam deferred. No complaints  LABORATORY DATA:  I have reviewed the data as listed     Component Value Date/Time   NA 135 11/04/2022 1405   K 3.9 11/04/2022 1405   CL 99 11/04/2022 1405   CO2 27 11/04/2022 1405   GLUCOSE 85 11/04/2022 1405   BUN 20 11/04/2022 1405   CREATININE 0.98 11/04/2022 1405   CREATININE 1.06 (H) 08/16/2022 1018   CALCIUM 9.2 11/04/2022 1405   PROT 6.9 11/04/2022 1405   ALBUMIN 3.9 11/04/2022 1405   AST 19 11/04/2022 1405   ALT 13 11/04/2022 1405   ALKPHOS 40 11/04/2022 1405   BILITOT 0.4 11/04/2022 1405   GFRNONAA 58 (L) 11/04/2022 1405   GFRNONAA 53 (L) 08/16/2022 1018    No results found for: "SPEP", "UPEP"  Lab Results  Component Value Date   WBC 4.6 01/04/2023   NEUTROABS 2.4 01/04/2023   HGB 12.0 01/04/2023   HCT 35.5 (L) 01/04/2023   MCV 109.2 (H) 01/04/2023   PLT 298 01/04/2023      Chemistry      Component Value Date/Time   NA 135 11/04/2022 1405   K 3.9 11/04/2022 1405   CL 99 11/04/2022 1405   CO2 27 11/04/2022 1405    BUN 20 11/04/2022 1405   CREATININE 0.98 11/04/2022 1405   CREATININE 1.06 (H) 08/16/2022 1018      Component Value Date/Time   CALCIUM 9.2 11/04/2022 1405   ALKPHOS 40 11/04/2022 1405   AST 19 11/04/2022 1405   ALT 13 11/04/2022 1405   BILITOT 0.4 11/04/2022 1405       RADIOGRAPHIC STUDIES: I have personally reviewed the radiological images as listed and agreed with the findings in the report. No results found.

## 2023-01-04 NOTE — Progress Notes (Signed)
Patient has a vascular doctor appointment on December 6th.

## 2023-01-18 ENCOUNTER — Inpatient Hospital Stay: Payer: Medicare Other

## 2023-01-18 DIAGNOSIS — D473 Essential (hemorrhagic) thrombocythemia: Secondary | ICD-10-CM | POA: Diagnosis not present

## 2023-01-18 LAB — CMP (CANCER CENTER ONLY)
ALT: 16 U/L (ref 0–44)
AST: 20 U/L (ref 15–41)
Albumin: 3.8 g/dL (ref 3.5–5.0)
Alkaline Phosphatase: 44 U/L (ref 38–126)
Anion gap: 11 (ref 5–15)
BUN: 23 mg/dL (ref 8–23)
CO2: 28 mmol/L (ref 22–32)
Calcium: 9.4 mg/dL (ref 8.9–10.3)
Chloride: 101 mmol/L (ref 98–111)
Creatinine: 1.15 mg/dL — ABNORMAL HIGH (ref 0.44–1.00)
GFR, Estimated: 48 mL/min — ABNORMAL LOW (ref >60)
Glucose, Bld: 90 mg/dL (ref 70–99)
Potassium: 4.3 mmol/L (ref 3.5–5.1)
Sodium: 140 mmol/L (ref 135–145)
Total Bilirubin: 0.6 mg/dL (ref <1.2)
Total Protein: 6.6 g/dL (ref 6.5–8.1)

## 2023-01-18 LAB — CBC WITH DIFFERENTIAL (CANCER CENTER ONLY)
Abs Immature Granulocytes: 0.02 10*3/uL (ref 0.00–0.07)
Basophils Absolute: 0 10*3/uL (ref 0.0–0.1)
Basophils Relative: 1 %
Eosinophils Absolute: 0.1 10*3/uL (ref 0.0–0.5)
Eosinophils Relative: 3 %
HCT: 35.5 % — ABNORMAL LOW (ref 36.0–46.0)
Hemoglobin: 11.8 g/dL — ABNORMAL LOW (ref 12.0–15.0)
Immature Granulocytes: 1 %
Lymphocytes Relative: 30 %
Lymphs Abs: 1.2 10*3/uL (ref 0.7–4.0)
MCH: 36.2 pg — ABNORMAL HIGH (ref 26.0–34.0)
MCHC: 33.2 g/dL (ref 30.0–36.0)
MCV: 108.9 fL — ABNORMAL HIGH (ref 80.0–100.0)
Monocytes Absolute: 0.5 10*3/uL (ref 0.1–1.0)
Monocytes Relative: 13 %
Neutro Abs: 2.1 10*3/uL (ref 1.7–7.7)
Neutrophils Relative %: 52 %
Platelet Count: 315 10*3/uL (ref 150–400)
RBC: 3.26 MIL/uL — ABNORMAL LOW (ref 3.87–5.11)
RDW: 12.2 % (ref 11.5–15.5)
WBC Count: 4 10*3/uL (ref 4.0–10.5)
nRBC: 0 % (ref 0.0–0.2)

## 2023-01-28 ENCOUNTER — Other Ambulatory Visit (INDEPENDENT_AMBULATORY_CARE_PROVIDER_SITE_OTHER): Payer: Medicare Other

## 2023-01-28 ENCOUNTER — Encounter (INDEPENDENT_AMBULATORY_CARE_PROVIDER_SITE_OTHER): Payer: Medicare Other | Admitting: Nurse Practitioner

## 2023-02-01 ENCOUNTER — Inpatient Hospital Stay: Payer: Medicare Other | Attending: Internal Medicine

## 2023-02-01 ENCOUNTER — Other Ambulatory Visit (INDEPENDENT_AMBULATORY_CARE_PROVIDER_SITE_OTHER): Payer: Self-pay | Admitting: Nurse Practitioner

## 2023-02-01 ENCOUNTER — Inpatient Hospital Stay (HOSPITAL_BASED_OUTPATIENT_CLINIC_OR_DEPARTMENT_OTHER): Payer: Medicare Other | Admitting: Internal Medicine

## 2023-02-01 VITALS — BP 127/68 | HR 74 | Temp 98.6°F | Resp 16 | Wt 123.0 lb

## 2023-02-01 DIAGNOSIS — L819 Disorder of pigmentation, unspecified: Secondary | ICD-10-CM

## 2023-02-01 DIAGNOSIS — R230 Cyanosis: Secondary | ICD-10-CM

## 2023-02-01 DIAGNOSIS — D473 Essential (hemorrhagic) thrombocythemia: Secondary | ICD-10-CM

## 2023-02-01 LAB — CMP (CANCER CENTER ONLY)
ALT: 17 U/L (ref 0–44)
AST: 20 U/L (ref 15–41)
Albumin: 4 g/dL (ref 3.5–5.0)
Alkaline Phosphatase: 41 U/L (ref 38–126)
Anion gap: 9 (ref 5–15)
BUN: 19 mg/dL (ref 8–23)
CO2: 28 mmol/L (ref 22–32)
Calcium: 9.5 mg/dL (ref 8.9–10.3)
Chloride: 103 mmol/L (ref 98–111)
Creatinine: 1.06 mg/dL — ABNORMAL HIGH (ref 0.44–1.00)
GFR, Estimated: 53 mL/min — ABNORMAL LOW (ref >60)
Glucose, Bld: 85 mg/dL (ref 70–99)
Potassium: 4.5 mmol/L (ref 3.5–5.1)
Sodium: 140 mmol/L (ref 135–145)
Total Bilirubin: 0.5 mg/dL (ref <1.2)
Total Protein: 6.7 g/dL (ref 6.5–8.1)

## 2023-02-01 LAB — CBC WITH DIFFERENTIAL (CANCER CENTER ONLY)
Abs Immature Granulocytes: 0.01 10*3/uL (ref 0.00–0.07)
Basophils Absolute: 0 10*3/uL (ref 0.0–0.1)
Basophils Relative: 0 %
Eosinophils Absolute: 0.1 10*3/uL (ref 0.0–0.5)
Eosinophils Relative: 2 %
HCT: 36.8 % (ref 36.0–46.0)
Hemoglobin: 12 g/dL (ref 12.0–15.0)
Immature Granulocytes: 0 %
Lymphocytes Relative: 34 %
Lymphs Abs: 1.7 10*3/uL (ref 0.7–4.0)
MCH: 35.1 pg — ABNORMAL HIGH (ref 26.0–34.0)
MCHC: 32.6 g/dL (ref 30.0–36.0)
MCV: 107.6 fL — ABNORMAL HIGH (ref 80.0–100.0)
Monocytes Absolute: 0.5 10*3/uL (ref 0.1–1.0)
Monocytes Relative: 11 %
Neutro Abs: 2.6 10*3/uL (ref 1.7–7.7)
Neutrophils Relative %: 53 %
Platelet Count: 393 10*3/uL (ref 150–400)
RBC: 3.42 MIL/uL — ABNORMAL LOW (ref 3.87–5.11)
RDW: 11.9 % (ref 11.5–15.5)
WBC Count: 5 10*3/uL (ref 4.0–10.5)
nRBC: 0 % (ref 0.0–0.2)

## 2023-02-01 NOTE — Progress Notes (Signed)
Patient is doing well, now new questions or concerns for the doctor today.

## 2023-02-01 NOTE — Progress Notes (Signed)
Cassandra Johns OFFICE PROGRESS NOTE  CURRENT TREATMENT-  Hydroxyurea 500 mg on weekdays and 1000 mg on weekends.   ASSESSMENT & PLAN:  [name] is a 82 yo F with pmh of GERD and TIA was referred to hematology clinic for further work-up of thrombocytosis.  #Essential thrombocytosis, JAK2 mutation, high risk  - Patient has elevated platelet count dating back to Aug 2019 and has been slowly trending up with most recent of 647 in July 2023. WBC and HB is normal. - Iron panel normal.  ESR CRP normal.  BCR-ABL by FISH negative.  LDH normal.  No splenomegaly on exam.  -Bone marrow biopsy from 10/21/2021 showed normocellular marrow, megakaryocytic atypia with hyperlobated nuclei and few hypolobated forms favoring ET. Cytogenetics showed normal karyotype.  -Hydroxyurea on hold since 12/21/2022.  For cyanosis of right hand.  Vascular issue vs drug-induced from hydroxyurea.  She is scheduled to follow-up with vascular on December 11.  She is on Plavix.  Per vascular, was advised to add aspirin 81 mg daily.  Labs reviewed.  Platelets are 393 today.  Once she has seen vascular, and there is no concerns on the exam I would like to do a rechallenge of hydroxyurea and monitor her symptoms closely.  I will follow-up with her in 3 months with labs.  I will reach out to her once I have vascular assessment to resume hydroxyurea.   Orders Placed This Encounter  Procedures   CBC with Differential ([facility] Only)    Standing Status:   Future    Standing Expiration Date:   02/01/2024   CMP ([facility] only)    Standing Status:   Future    Standing Expiration Date:   02/01/2024   RTC in 3 months for MD visit, labs  The total time spent in the appointment was 30 minutes encounter with patients including review of chart and various tests results, discussions about plan of care and coordination of care plan   All questions were answered. The patient knows to call the clinic with any  problems, questions or concerns. No barriers to learning was detected.    Michaelyn Barter, MD 12/10/20243:34 PM  INTERVAL HISTORY: Cassandra Johns 82 y.o. female returns for to discuss lab results for work up of thrombocytosis   SUMMARY OF HEMATOLOGIC HISTORY: Cassandra Johns is a 82 y.o. female with pmh of TIA and GERD  Patient was seen as initial visit on 09/25/2021 for work up of thrombocytosis. Patient denied fever, unexplained weight loss, shortness of breath, chest pain, lumps, change in appetite, headache, vision changes, pruritis.  Denies any history of blood clots. Reports episode of TIA in 2011.  Reports history of Raynaud's phenomenon 2-3 times a year.  Patient has elevated platelet count dating back to Aug 2019 and has been slowly trending up with most recent of 647 in July 2023. WBC and HB is normal. Further work-up showed normal iron studies and ESR/CRP. JAK2 mutation positive.  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  I have reviewed the past medical history, past surgical history, social history and family history with the patient  and they are unchanged from previous note.  INTERVAL HISTORY:  Patient seen today for toxicity check for hydroxyurea. Doing well overall.  Denies any further episodes of hand cyanosis.  She is scheduled to's follow-up with vascular tomorrow.  Denies any new concerns.    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  MEDICATIONS:  Current Outpatient Medications  Medication Sig Dispense  Refill   acidophilus (RISAQUAD) CAPS capsule Take 1 capsule by mouth daily.     b complex vitamins tablet Take 1 tablet by mouth daily.     Beta Glucan 250 MG CAPS Take by mouth.     Cholecalciferol (VITAMIN D3) 125 MCG (5000 UT) CAPS Take 5,000 Units by mouth daily.     citalopram (CELEXA) 10 MG tablet Take 10 mg by mouth daily.     clopidogrel (PLAVIX) 75 MG tablet Take 75 mg by mouth daily.     Coenzyme Q10 400 MG CAPS Take 800 mg by mouth daily.      famotidine (PEPCID) 20 MG tablet Take 20 mg by mouth daily.     hydroxyurea (HYDREA) 500 MG capsule TAKE 1 CAPSULE BY MOUTH ONCE DAILY WITH FOOD TO MINIMIZE GI SIDE EFFECTS 90 capsule 0   levothyroxine (SYNTHROID, LEVOTHROID) 75 MCG tablet Take 75 mcg by mouth daily before breakfast.     magnesium oxide (MAG-OX) 400 MG tablet Take 400 mg by mouth daily.     MAGNESIUM PO Take 1 tablet by mouth daily.     omega-3 acid ethyl esters (LOVAZA) 1 g capsule Take 1 g by mouth daily.      omeprazole (PRILOSEC) 20 MG capsule Take 20 mg by mouth daily before lunch.      OVER THE COUNTER MEDICATION Take 2 capsules by mouth daily. Bone Up     OVER THE COUNTER MEDICATION Take 1 tablet by mouth daily. ARTERIAL PROTECT     PREMARIN 0.45 MG tablet Take 0.45 mg by mouth daily.     Red Yeast Rice 600 MG CAPS Take 1,200 mg by mouth Nightly.     RESVERATROL PO Take by mouth daily.     Turmeric 500 MG CAPS Take 1,000 mg by mouth daily.     vitamin E 400 UNIT capsule Take 400 Units by mouth every other day.      No current facility-administered medications for this visit.     REVIEW OF SYSTEMS:   Constitutional: Denies fevers, chills or night sweats Eyes: Denies blurriness of vision Ears, nose, mouth, throat, and face: Denies mucositis or sore throat Respiratory: Denies cough, dyspnea or wheezes Cardiovascular: Denies palpitation, chest discomfort or lower extremity swelling Gastrointestinal:  heartburn  Skin: Denies abnormal skin rashes Lymphatics: Denies  new lymphadenopathy or easy bruising Neurological:Denies numbness, tingling or new weaknesses Behavioral/Psych: Mood is stable, no new changes  All other systems were reviewed with the patient and are negative.  PHYSICAL EXAMINATION: ECOG PERFORMANCE STATUS: 1 - Symptomatic but completely ambulatory  Vitals:   02/01/23 1430  BP: 127/68  Pulse: 74  Resp: 16  Temp: 98.6 F (37 C)  SpO2: 98%      Filed Weights   02/01/23 1430  Weight: 123 lb (55.8 kg)       Exam deferred. No complaints  LABORATORY DATA:  I have reviewed the data as listed     Component Value Date/Time   NA 140 02/01/2023 1420   K 4.5 02/01/2023 1420   CL 103 02/01/2023 1420   CO2 28  02/01/2023 1420   GLUCOSE 85 02/01/2023 1420   BUN 19 02/01/2023 1420   CREATININE 1.06 (H) 02/01/2023 1420   CALCIUM 9.5 02/01/2023 1420   PROT 6.7 02/01/2023 1420   ALBUMIN 4.0 02/01/2023 1420   AST 20 02/01/2023 1420   ALT 17 02/01/2023 1420   ALKPHOS 41 02/01/2023 1420   BILITOT 0.5 02/01/2023 1420   GFRNONAA 53 (L) 02/01/2023 1420    No results found for: "SPEP", "UPEP"  Lab Results  Component Value Date   WBC 5.0 02/01/2023   NEUTROABS 2.6 02/01/2023   HGB 12.0 02/01/2023   HCT 36.8 02/01/2023   MCV 107.6 (H) 02/01/2023   PLT 393 02/01/2023      Chemistry      Component Value Date/Time   NA 140 02/01/2023 1420   K 4.5 02/01/2023 1420   CL 103 02/01/2023 1420   CO2 28 02/01/2023 1420   BUN 19 02/01/2023 1420   CREATININE 1.06 (H) 02/01/2023 1420      Component Value Date/Time   CALCIUM 9.5 02/01/2023 1420   ALKPHOS 41 02/01/2023 1420   AST 20 02/01/2023 1420   ALT 17 02/01/2023 1420   BILITOT 0.5 02/01/2023 1420       RADIOGRAPHIC STUDIES: I have personally reviewed the radiological images as listed and agreed with the findings in the report. No results found.

## 2023-02-02 ENCOUNTER — Ambulatory Visit (INDEPENDENT_AMBULATORY_CARE_PROVIDER_SITE_OTHER): Payer: Medicare Other

## 2023-02-02 ENCOUNTER — Ambulatory Visit (INDEPENDENT_AMBULATORY_CARE_PROVIDER_SITE_OTHER): Payer: Medicare Other | Admitting: Nurse Practitioner

## 2023-02-02 ENCOUNTER — Encounter (INDEPENDENT_AMBULATORY_CARE_PROVIDER_SITE_OTHER): Payer: Self-pay | Admitting: Nurse Practitioner

## 2023-02-02 VITALS — BP 130/71 | HR 68 | Resp 16 | Ht 63.5 in | Wt 122.8 lb

## 2023-02-02 DIAGNOSIS — L819 Disorder of pigmentation, unspecified: Secondary | ICD-10-CM

## 2023-02-07 ENCOUNTER — Encounter (INDEPENDENT_AMBULATORY_CARE_PROVIDER_SITE_OTHER): Payer: Self-pay | Admitting: Nurse Practitioner

## 2023-02-07 ENCOUNTER — Telehealth: Payer: Self-pay

## 2023-02-07 NOTE — Telephone Encounter (Signed)
Called and spoke to patient to inform her to continue her hydroxyurea same dose as before. 500 mg Monday to Friday. Patient understood and agreed with the plan.

## 2023-02-07 NOTE — Progress Notes (Signed)
Subjective:    Patient ID: Cassandra Johns, female    DOB: 09-02-1940, 82 y.o.   MRN: [id number] Chief Complaint  Patient presents with   New Patient (Initial Visit)    Ref B[employer] consult discoloration of skin of finger    Cassandra Johns is an 82 year old female who presents today for evaluation of possible vascular disease given discoloration of her right middle finger.  When it occurred and it was about 24 to 48 hours and longevity.  She notes that after taking a shower.  She denies any injury to the finger itself.  She denies that it was painful but it was numb but it has been slowly improving since the episode.  The patient was being treated currently for normocytosis.  Today, her noninvasive studies show no obstructions in the right upper extremity arterial system.  The patient has strong triphasic flow primarily throughout the right upper extremity.  The right upper extremity digital PPG show normal waveforms.  Following the episode patient was started on Plavix and aspirin and has had no recurrence.    Review of Systems  Skin:  Positive for color change.  All other systems reviewed and are negative.      Objective:   Physical Exam Vitals reviewed.  HENT:     Head: Normocephalic.  Cardiovascular:     Rate and Rhythm: Normal rate.     Pulses:          Radial pulses are 2+ on the right side.  Pulmonary:     Effort: Pulmonary effort is normal.  Skin:    General: Skin is warm and dry.     Capillary Refill: Capillary refill takes less than 2 seconds.  Neurological:     Mental Status: She is alert and oriented to person, place, and time.  Psychiatric:        Mood and Affect: Mood normal.        Behavior: Behavior normal.        Thought Content: Thought content normal.        Judgment: Judgment normal.     BP 130/71 (BP Location: Right Arm)   Pulse 68   Resp 16   Ht 5' 3.5" (1.613 m)   Wt 122 lb 12.8 oz (55.7 kg)   BMI 21.41 kg/m   Past Medical History:  Diagnosis  Date   Chronic constipation    Diverticulosis    Esophagitis    GERD (gastroesophageal reflux disease)    History of colon polyps    Hyperlipidemia    Hypertension    h/o bp now controlled   Hypothyroidism    Stroke (HCC)     TIA-2011   UTI (urinary tract infection)     Social History   Socioeconomic History   Marital status: Married    Spouse name: Not on file   Number of children: Not on file   Years of education: Not on file   Highest education level: Not on file  Occupational History   Not on file  Tobacco Use   Smoking status: Never   Smokeless tobacco: Never  Vaping Use   Vaping status: Never Used  Substance and Sexual Activity   Alcohol use: Yes    Comment: occ   Drug use: No   Sexual activity: Not on file  Other Topics Concern   Not on file  Social History Narrative   Not on file   Social Drivers of Health   Financial Resource Strain: Low Risk  (  04/22/2022)   Received from [facility], [facility]   Overall Financial Resource Strain (CARDIA)    Difficulty of Paying Living Expenses: Not hard at all  Food Insecurity: No Food Insecurity (04/22/2022)   Received from [facility], [facility]   Hunger Vital Sign    Worried About Running Out of Food in the Last Year: Never true    Ran Out of Food in the Last Year: Never true  Transportation Needs: No Transportation Needs (04/22/2022)   Received from [facility], [facility]   [facility] - Transportation    In the past 12 months, has lack of transportation kept you from medical appointments or from getting medications?: No    Lack of Transportation (Non-Medical): No  Physical Activity: Not on file  Stress: Not on file  Social Connections: Not on file  Intimate Partner Violence: Not on file    Past Surgical History:  Procedure Laterality Date   ABDOMINAL HYSTERECTOMY     WITH BSO   BACK SURGERY      CERVICAL LAMINECTOMY   BROW LIFT Bilateral 08/08/2020   Procedure: BROW PTOSIS REPAIR BILATERAL;  Surgeon: Imagene Riches, MD;  Location: [facility];  Service: Ophthalmology;  Laterality: Bilateral;   CERVICAL LAMINECTOMY     X 2   COLONOSCOPY     COLONOSCOPY WITH PROPOFOL N/A 02/28/2017   Procedure: COLONOSCOPY WITH PROPOFOL;  Surgeon: Christena Deem, MD;  Location: [facility];  Service: Endoscopy;  Laterality: N/A;   CYSTOCELE REPAIR N/A 12/14/2019   Procedure: ANTERIOR REPAIR (CYSTOCELE);  Surgeon: Ward, Elenora Fender, MD;  Location: [facility] ORS;  Service: Gynecology;  Laterality: N/A;   ESOPHAGOGASTRODUODENOSCOPY      Family History  Problem Relation Age of Onset   Heart attack Father    Brain cancer Sister    Heart attack Brother    Breast cancer Paternal Grandmother    Breast cancer Maternal Aunt        40's    Allergies  Allergen Reactions   Lactose Diarrhea    (Negative by test 2022)   Lactose Intolerance (Gi)     (Negative by test 2022)       Latest Ref Rng & Units 02/01/2023    2:20 PM 01/18/2023   11:03 AM 01/04/2023    1:09 PM  CBC  WBC 4.0 - 10.5 K/uL 5.0  4.0  4.6   Hemoglobin 12.0 - 15.0 g/dL 16.1  09.6  04.5   Hematocrit 36.0 - 46.0 % 36.8  35.5  35.5   Platelets 150 - 400 K/uL 393  315  298       CMP     Component Value Date/Time   NA 140 02/01/2023 1420   K 4.5 02/01/2023 1420   CL 103 02/01/2023 1420   CO2 28 02/01/2023 1420   GLUCOSE 85 02/01/2023 1420   BUN 19 02/01/2023 1420   CREATININE 1.06 (H) 02/01/2023 1420   CALCIUM 9.5 02/01/2023 1420   PROT 6.7 02/01/2023 1420   ALBUMIN 4.0 02/01/2023 1420   AST 20 02/01/2023 1420   ALT 17 02/01/2023 1420   ALKPHOS 41 02/01/2023 1420   BILITOT 0.5 02/01/2023 1420   GFRNONAA 53 (L) 02/01/2023 1420     No results found.     Assessment & Plan:   1. Discoloration of skin of finger (Primary) Patient is doing well after initiation of Plavix and aspirin and she has  not had a  recurrence of the discoloration.  Her noninvasive studies did not show any large vessel occlusions.  Based on this we suspect that it is related to likely small vessel thrombosis from an elevated platelet count, which has returned back to normal levels.  There is also likely component of vasospasm as well.  Given this no further intervention is necessary at this time.  Patient to remain on Plavix and aspirin and will continue to follow with her oncologist for treatment.   Current Outpatient Medications on File Prior to Visit  Medication Sig Dispense Refill   acidophilus (RISAQUAD) CAPS capsule Take 1 capsule by mouth daily.     b complex vitamins tablet Take 1 tablet by mouth daily.     Beta Glucan 250 MG CAPS Take by mouth.     Cholecalciferol (VITAMIN D3) 125 MCG (5000 UT) CAPS Take 5,000 Units by mouth daily.     citalopram (CELEXA) 10 MG tablet Take 10 mg by mouth daily.     clopidogrel (PLAVIX) 75 MG tablet Take 75 mg by mouth daily.     Coenzyme Q10 400 MG CAPS Take 800 mg by mouth daily.      famotidine (PEPCID) 20 MG tablet Take 20 mg by mouth daily.     hydroxyurea (HYDREA) 500 MG capsule TAKE 1 CAPSULE BY MOUTH ONCE DAILY WITH FOOD TO MINIMIZE GI SIDE EFFECTS 90 capsule 0   levothyroxine (SYNTHROID, LEVOTHROID) 75 MCG tablet Take 75 mcg by mouth daily before breakfast.     magnesium oxide (MAG-OX) 400 MG tablet Take 400 mg by mouth daily.     MAGNESIUM PO Take 1 tablet by mouth daily.     omega-3 acid ethyl esters (LOVAZA) 1 g capsule Take 1 g by mouth daily.      omeprazole (PRILOSEC) 20 MG capsule Take 20 mg by mouth daily before lunch.      OVER THE COUNTER MEDICATION Take 2 capsules by mouth daily. Bone Up     OVER THE COUNTER MEDICATION Take 1 tablet by mouth daily. ARTERIAL PROTECT     PREMARIN 0.45 MG tablet Take 0.45 mg by mouth daily.     Red Yeast Rice 600 MG CAPS Take 1,200 mg by mouth Nightly.     RESVERATROL PO Take by mouth daily.     Turmeric 500 MG CAPS Take 1,000 mg  by mouth daily.     vitamin E 400 UNIT capsule Take 400 Units by mouth every other day.      No current facility-administered medications on file prior to visit.    There are no Patient Instructions on file for this visit. No follow-ups on file.   Georgiana Spinner, NP

## 2023-04-11 ENCOUNTER — Other Ambulatory Visit: Payer: Self-pay | Admitting: Internal Medicine

## 2023-05-02 ENCOUNTER — Inpatient Hospital Stay: Payer: Medicare Other | Attending: Internal Medicine

## 2023-05-02 ENCOUNTER — Encounter: Payer: Self-pay | Admitting: Internal Medicine

## 2023-05-02 ENCOUNTER — Inpatient Hospital Stay (HOSPITAL_BASED_OUTPATIENT_CLINIC_OR_DEPARTMENT_OTHER): Payer: Medicare Other | Admitting: Internal Medicine

## 2023-05-02 VITALS — BP 119/65 | HR 73 | Temp 97.6°F | Resp 19 | Wt 126.4 lb

## 2023-05-02 DIAGNOSIS — L819 Disorder of pigmentation, unspecified: Secondary | ICD-10-CM | POA: Diagnosis not present

## 2023-05-02 DIAGNOSIS — D473 Essential (hemorrhagic) thrombocythemia: Secondary | ICD-10-CM | POA: Diagnosis not present

## 2023-05-02 DIAGNOSIS — R230 Cyanosis: Secondary | ICD-10-CM | POA: Diagnosis not present

## 2023-05-02 DIAGNOSIS — Z7982 Long term (current) use of aspirin: Secondary | ICD-10-CM | POA: Diagnosis not present

## 2023-05-02 LAB — CBC WITH DIFFERENTIAL (CANCER CENTER ONLY)
Abs Immature Granulocytes: 0.02 10*3/uL (ref 0.00–0.07)
Basophils Absolute: 0 10*3/uL (ref 0.0–0.1)
Basophils Relative: 1 %
Eosinophils Absolute: 0.1 10*3/uL (ref 0.0–0.5)
Eosinophils Relative: 2 %
HCT: 38.5 % (ref 36.0–46.0)
Hemoglobin: 12.7 g/dL (ref 12.0–15.0)
Immature Granulocytes: 1 %
Lymphocytes Relative: 30 %
Lymphs Abs: 1.2 10*3/uL (ref 0.7–4.0)
MCH: 35 pg — ABNORMAL HIGH (ref 26.0–34.0)
MCHC: 33 g/dL (ref 30.0–36.0)
MCV: 106.1 fL — ABNORMAL HIGH (ref 80.0–100.0)
Monocytes Absolute: 0.5 10*3/uL (ref 0.1–1.0)
Monocytes Relative: 12 %
Neutro Abs: 2.2 10*3/uL (ref 1.7–7.7)
Neutrophils Relative %: 54 %
Platelet Count: 366 10*3/uL (ref 150–400)
RBC: 3.63 MIL/uL — ABNORMAL LOW (ref 3.87–5.11)
RDW: 14.8 % (ref 11.5–15.5)
WBC Count: 4.1 10*3/uL (ref 4.0–10.5)
nRBC: 0 % (ref 0.0–0.2)

## 2023-05-02 LAB — CMP (CANCER CENTER ONLY)
ALT: 18 U/L (ref 0–44)
AST: 22 U/L (ref 15–41)
Albumin: 4.1 g/dL (ref 3.5–5.0)
Alkaline Phosphatase: 44 U/L (ref 38–126)
Anion gap: 9 (ref 5–15)
BUN: 28 mg/dL — ABNORMAL HIGH (ref 8–23)
CO2: 28 mmol/L (ref 22–32)
Calcium: 9.4 mg/dL (ref 8.9–10.3)
Chloride: 100 mmol/L (ref 98–111)
Creatinine: 1.15 mg/dL — ABNORMAL HIGH (ref 0.44–1.00)
GFR, Estimated: 48 mL/min — ABNORMAL LOW (ref >60)
Glucose, Bld: 89 mg/dL (ref 70–99)
Potassium: 4.3 mmol/L (ref 3.5–5.1)
Sodium: 137 mmol/L (ref 135–145)
Total Bilirubin: 0.5 mg/dL (ref 0.0–1.2)
Total Protein: 7 g/dL (ref 6.5–8.1)

## 2023-05-02 NOTE — Progress Notes (Unsigned)
 Patient has no concerns

## 2023-05-03 ENCOUNTER — Telehealth: Payer: Self-pay | Admitting: *Deleted

## 2023-05-03 NOTE — Progress Notes (Signed)
 [facility] OFFICE PROGRESS NOTE  Reason for visit-essential thrombocytosis, JAK2 positive, high risk.  CURRENT TREATMENT-  Hydroxyurea 500 mg 5 days a week.  ASSESSMENT & PLAN:  [name] is a 83 yo F with pmh of GERD and TIA was referred to hematology clinic for further work-up of thrombocytosis.  #Essential thrombocytosis, JAK2 mutation, high risk  - Patient has elevated platelet count dating back to Aug 2019 and has been slowly trending up with most recent of 647 in July 2023. WBC and HB is normal. - Iron panel normal.  ESR CRP normal.  BCR-ABL by FISH negative.  LDH normal.  No splenomegaly on exam.  -Bone marrow biopsy from 10/21/2021 showed normocellular marrow, megakaryocytic atypia with hyperlobated nuclei and few hypolobated forms favoring ET. Cytogenetics showed normal karyotype.  -Continue with hydroxyurea 500 mg daily 5 days a week.  This dose has been enough for her to maintain platelet level less than 400.  Platelet today 366.  She had transient episode of right arm cyanosis.  Evaluated by vascular and Dopplers were negative.  Could be small vessel disease and component of vasospasm.  She does report history of possible Raynaud's in her family.  She has not had further event for more than 3 months.  Her platelets have been well-controlled.  I discussed the case with vascular and it is okay to discontinue Plavix.  Continue with aspirin 81 mg indefinitely.  Does have history of TIA in 2016.   Orders Placed This Encounter  Procedures   CBC with Differential (Cancer Center Only)    Standing Status:   Future    Expected Date:   09/01/2023    Expiration Date:   05/01/2024   CMP (Cancer Center only)    Standing Status:   Future    Expected Date:   09/01/2023    Expiration Date:   05/01/2024   RTC in 4 months for MD visit, labs.  The total time spent in the appointment was 30 minutes encounter with patients including review of chart and various tests results,  discussions about plan of care and coordination of care plan   All questions were answered. The patient knows to call the clinic with any problems, questions or concerns. No barriers to learning was detected.    Michaelyn Barter, MD 3/11/202510:29 AM  INTERVAL HISTORY: Cassandra Johns 83 y.o. female returns for to discuss lab results for work up of thrombocytosis   SUMMARY OF HEMATOLOGIC HISTORY: Cassandra Johns is a 83 y.o. female with pmh of TIA and GERD  Patient was seen as initial visit on 09/25/2021 for work up of thrombocytosis. Patient denied fever, unexplained weight loss, shortness of breath, chest pain, lumps, change in appetite, headache, vision changes, pruritis.  Denies any history of blood clots. Reports episode of TIA in 2011.  Reports history of Raynaud's phenomenon 2-3 times a year.  Patient has elevated platelet count dating back to Aug 2019 and has been slowly trending up with most recent of 647 in July 2023. WBC and HB is normal. Further work-up showed normal iron studies and ESR/CRP. JAK2 mutation positive.  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  I have reviewed the past medical history, past surgical history, social history and family history with the patient and they are unchanged from previous note.  INTERVAL HISTORY:  Patient seen today for toxicity check for hydroxyurea. She is doing well overall.  Denies any further event of hand cyanosis.  Denies any fatigue.    ALLERGIES:  is allergic to lactose and lactose intolerance  (gi).  MEDICATIONS:  Current Outpatient Medications  Medication Sig Dispense Refill   acidophilus (RISAQUAD) CAPS capsule Take 1 capsule by mouth daily.     b complex vitamins tablet Take 1 tablet by mouth daily.     Beta Glucan 250 MG CAPS Take by mouth.     Cholecalciferol (VITAMIN D3) 125 MCG (5000 UT) CAPS Take 5,000 Units by mouth daily.     citalopram (CELEXA) 10 MG tablet Take 10 mg by mouth daily.     clopidogrel (PLAVIX) 75 MG tablet Take 75 mg by mouth daily.     Coenzyme Q10 400 MG CAPS Take 800 mg by mouth daily.      famotidine (PEPCID) 20 MG tablet Take 20 mg by mouth daily.     hydroxyurea (HYDREA) 500 MG capsule TAKE 1 CAPSULE BY MOUTH ONCE DAILY WITH FOOD TO MINIMIZE GI SIDE EFFECTS 90 capsule 0   levothyroxine (SYNTHROID, LEVOTHROID) 75 MCG tablet Take 75 mcg by mouth daily before breakfast.     magnesium oxide (MAG-OX) 400 MG tablet Take 400 mg by mouth daily.     MAGNESIUM PO Take 1 tablet by mouth daily.     omega-3 acid ethyl esters (LOVAZA) 1 g capsule Take 1 g by mouth daily.      omeprazole (PRILOSEC) 20 MG capsule Take 20 mg by mouth daily before lunch.      OVER THE COUNTER MEDICATION Take 2 capsules by mouth daily. Bone Up     OVER THE COUNTER MEDICATION Take 1 tablet by mouth daily. ARTERIAL PROTECT     PREMARIN 0.45 MG tablet Take 0.45 mg by mouth daily.     Red Yeast Rice 600 MG CAPS Take 1,200 mg by mouth Nightly.     RESVERATROL PO Take by mouth daily.     Turmeric 500 MG CAPS Take 1,000 mg by mouth daily.     vitamin E 400 UNIT capsule Take 400 Units by mouth every other day.      No current facility-administered medications for this visit.     REVIEW OF SYSTEMS:   Constitutional: Denies fevers, chills or night sweats Eyes: Denies blurriness of vision Ears, nose, mouth, throat, and face: Denies mucositis or sore throat Respiratory: Denies cough, dyspnea or wheezes Cardiovascular: Denies palpitation, chest discomfort or lower extremity  swelling Gastrointestinal:  heartburn  Skin: Denies abnormal skin rashes Lymphatics: Denies new lymphadenopathy or easy bruising Neurological:Denies numbness, tingling or new weaknesses Behavioral/Psych: Mood is stable, no new changes  All other systems were reviewed with the patient and are negative.  PHYSICAL EXAMINATION: ECOG PERFORMANCE STATUS: 1 - Symptomatic but completely ambulatory  Vitals:   05/02/23 1439  BP: 119/65  Pulse: 73  Resp: 19  Temp: 97.6 F (36.4 C)  SpO2: 99%      Filed Weights   05/02/23 1439  Weight: 126 lb 6.4 oz (57.3 kg)       Exam deferred. No complaints  LABORATORY DATA:  I have reviewed the data as listed     Component Value Date/Time  NA 137 05/02/2023 1433   K 4.3 05/02/2023 1433   CL 100 05/02/2023 1433   CO2 28 05/02/2023 1433   GLUCOSE 89 05/02/2023 1433   BUN 28 (H) 05/02/2023 1433   CREATININE 1.15 (H) 05/02/2023 1433   CALCIUM 9.4 05/02/2023 1433   PROT 7.0 05/02/2023 1433   ALBUMIN 4.1 05/02/2023 1433   AST 22 05/02/2023 1433   ALT 18 05/02/2023 1433   ALKPHOS 44 05/02/2023 1433   BILITOT 0.5 05/02/2023 1433   GFRNONAA 48 (L) 05/02/2023 1433    No results found for: "SPEP", "UPEP"  Lab Results  Component Value Date   WBC 4.1 05/02/2023   NEUTROABS 2.2 05/02/2023   HGB 12.7 05/02/2023   HCT 38.5 05/02/2023   MCV 106.1 (H) 05/02/2023   PLT 366 05/02/2023      Chemistry      Component Value Date/Time   NA 137 05/02/2023 1433   K 4.3 05/02/2023 1433   CL 100 05/02/2023 1433   CO2 28 05/02/2023 1433   BUN 28 (H) 05/02/2023 1433   CREATININE 1.15 (H) 05/02/2023 1433      Component Value Date/Time   CALCIUM 9.4 05/02/2023 1433   ALKPHOS 44 05/02/2023 1433   AST 22 05/02/2023 1433   ALT 18 05/02/2023 1433   BILITOT 0.5 05/02/2023 1433       RADIOGRAPHIC STUDIES: I have personally reviewed the radiological images as listed and agreed with the findings in the report. No results found.

## 2023-05-03 NOTE — Telephone Encounter (Signed)
 Per Dr. Mervyn Skeeters- please inform the patient that I spoke with vascular. Okay to discontinue Plavix. Pt needs to be on aspirin 81 mg indefinitely.

## 2023-05-03 NOTE — Telephone Encounter (Signed)
 Spoke with patient and told her that Dr. Alena Bills talked with her Vascular provider and he said ok to discontinue the Plavix, but to take 81 mg Asprin daily indefinitely. Patient understood and agreed with this plan.

## 2023-07-12 ENCOUNTER — Encounter (INDEPENDENT_AMBULATORY_CARE_PROVIDER_SITE_OTHER): Payer: Self-pay

## 2023-07-13 ENCOUNTER — Other Ambulatory Visit: Payer: Self-pay | Admitting: Internal Medicine

## 2023-08-31 ENCOUNTER — Other Ambulatory Visit: Payer: Self-pay

## 2023-08-31 DIAGNOSIS — D473 Essential (hemorrhagic) thrombocythemia: Secondary | ICD-10-CM

## 2023-09-01 ENCOUNTER — Encounter: Payer: Self-pay | Admitting: Oncology

## 2023-09-01 ENCOUNTER — Inpatient Hospital Stay: Attending: Oncology

## 2023-09-01 ENCOUNTER — Inpatient Hospital Stay: Admitting: Oncology

## 2023-09-01 VITALS — BP 114/69 | HR 75 | Temp 97.9°F | Resp 18 | Wt 123.8 lb

## 2023-09-01 DIAGNOSIS — Z7982 Long term (current) use of aspirin: Secondary | ICD-10-CM | POA: Diagnosis not present

## 2023-09-01 DIAGNOSIS — D473 Essential (hemorrhagic) thrombocythemia: Secondary | ICD-10-CM

## 2023-09-01 DIAGNOSIS — D75839 Thrombocytosis, unspecified: Secondary | ICD-10-CM | POA: Diagnosis present

## 2023-09-01 LAB — CMP (CANCER CENTER ONLY)
ALT: 13 U/L (ref 0–44)
AST: 20 U/L (ref 15–41)
Albumin: 3.8 g/dL (ref 3.5–5.0)
Alkaline Phosphatase: 46 U/L (ref 38–126)
Anion gap: 9 (ref 5–15)
BUN: 28 mg/dL — ABNORMAL HIGH (ref 8–23)
CO2: 27 mmol/L (ref 22–32)
Calcium: 9.3 mg/dL (ref 8.9–10.3)
Chloride: 99 mmol/L (ref 98–111)
Creatinine: 1.11 mg/dL — ABNORMAL HIGH (ref 0.44–1.00)
GFR, Estimated: 50 mL/min — ABNORMAL LOW (ref >60)
Glucose, Bld: 95 mg/dL (ref 70–99)
Potassium: 4.5 mmol/L (ref 3.5–5.1)
Sodium: 135 mmol/L (ref 135–145)
Total Bilirubin: 0.5 mg/dL (ref 0.0–1.2)
Total Protein: 6.9 g/dL (ref 6.5–8.1)

## 2023-09-01 LAB — CBC WITH DIFFERENTIAL (CANCER CENTER ONLY)
Abs Immature Granulocytes: 0.01 K/uL (ref 0.00–0.07)
Basophils Absolute: 0 K/uL (ref 0.0–0.1)
Basophils Relative: 1 %
Eosinophils Absolute: 0.2 K/uL (ref 0.0–0.5)
Eosinophils Relative: 4 %
HCT: 37.9 % (ref 36.0–46.0)
Hemoglobin: 12.6 g/dL (ref 12.0–15.0)
Immature Granulocytes: 0 %
Lymphocytes Relative: 26 %
Lymphs Abs: 1 K/uL (ref 0.7–4.0)
MCH: 36.1 pg — ABNORMAL HIGH (ref 26.0–34.0)
MCHC: 33.2 g/dL (ref 30.0–36.0)
MCV: 108.6 fL — ABNORMAL HIGH (ref 80.0–100.0)
Monocytes Absolute: 0.5 K/uL (ref 0.1–1.0)
Monocytes Relative: 12 %
Neutro Abs: 2.2 K/uL (ref 1.7–7.7)
Neutrophils Relative %: 57 %
Platelet Count: 310 K/uL (ref 150–400)
RBC: 3.49 MIL/uL — ABNORMAL LOW (ref 3.87–5.11)
RDW: 12.8 % (ref 11.5–15.5)
WBC Count: 3.8 K/uL — ABNORMAL LOW (ref 4.0–10.5)
nRBC: 0 % (ref 0.0–0.2)

## 2023-09-01 NOTE — Assessment & Plan Note (Addendum)
 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 Currently on hydroxyurea  500 mg 5 days Mondays through Fridays and off on weekends. Labs reviewed discussed with patient. White count slightly decreased.  Platelet count is within normal limit. Recommend patient to hold her hydroxyurea  for the rest of the week [4 days.] Starting next week, recommend patient to take hydroxyurea  500 mg on Mondays, Tuesdays, Wednesdays, Thursdays, Fridays, Saturdays.,  Off Wednesdays and Saturdays. Continue aspirin 81 mg daily.

## 2023-09-01 NOTE — Progress Notes (Signed)
 Hematology/Oncology Progress note Telephone:([phone]) [phone] Fax:(336) [phone]     Reason for visit-Essential thrombocytosis, JAK2 positive, high risk.   ASSESSMENT & PLAN:  Cassandra Johns is a 83 yo F with pmh of GERD and TIA was referred to hematology clinic for further work-up of thrombocytosis.  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 Currently on hydroxyurea  500 mg 5 days Mondays through Fridays and off on weekends. Labs reviewed discussed with patient. White count slightly decreased.  Platelet count is within normal limit. Recommend patient to hold her hydroxyurea  for the rest of the week [4 days.] Starting next week, recommend patient to take hydroxyurea  500 mg on Mondays, Tuesdays, Wednesdays, Thursdays, Fridays, Saturdays.,  Off Wednesdays and Saturdays. Continue aspirin 81 mg daily.  Orders Placed This Encounter  Procedures   CBC with Differential (Cancer Center Only)    Standing Status:   Future    Expected Date:   10/13/2023    Expiration Date:   01/11/2024   Follow-up 6 weeks  We spent sufficient time to discuss many aspect of care, questions were answered to patient's satisfaction. A total of 25 minutes was spent on this visit.  With 5 minutes spent reviewing image findings, pathology reports, 15 minutes counseling the patient on the diagnosis, dosage adjustment additional 5 minutes was spent on answering patient's questions.   Cassandra Cap, MD, PhD [facility] Hematology Oncology 09/01/2023    INTERVAL HISTORY: Cassandra Johns 83 y.o. female returns for to discuss lab results for work up of thrombocytosis   SUMMARY OF HEMATOLOGIC HISTORY: Cassandra Johns is a 83 y.o. female with pmh of TIA and GERD  Patient was seen as initial visit on 09/25/2021 for work up of thrombocytosis. Patient denied fever, unexplained weight loss, shortness of breath, chest pain, lumps, change in appetite, headache,  vision changes, pruritis.  Denies any history of blood clots. Reports episode of TIA in 2011.  Reports history of Raynaud's phenomenon 2-3 times a year.  Patient has elevated platelet count dating back to Aug 2019 and has been slowly trending up with most recent of 647 in July 2023. WBC and HB is normal. Further work-up showed normal iron studies and ESR/CRP. JAK2 mutation positive.  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  She had transient episode of right arm cyanosis.  Evaluated by vascular and Dopplers were negative.  Could be small vessel disease and component of vasospasm.  She does report history of possible Raynaud's in her family.  She has not had further event for more than 3 months.  Her platelets have been well-controlled.  Dr A discussed the case with vascular and it is okay to discontinue Plavix.  Continue with aspirin 81 mg indefinitely.    Patient has a history of TIA in 2016.  INTERVAL HISTORY:  Patient seen today for toxicity check for hydroxyurea . Patient previously followed up with Dr.Agrawal. Patient switched care to me on 09/01/2023 Extensive medical record review was performed by me  She is doing well overall.  Denies any further event of hand cyanosis.  Denies any fatigue. Patient takes hydroxyurea  5 days/week.  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  MEDICATIONS:  Current Outpatient Medications  Medication Sig Dispense Refill   acidophilus (RISAQUAD) CAPS capsule Take 1 capsule by mouth daily.      b complex vitamins tablet Take 1 tablet by mouth daily.     Beta Glucan 250 MG CAPS Take by mouth.     Cholecalciferol (VITAMIN D3) 125 MCG (5000 UT) CAPS Take 5,000 Units by mouth daily.     citalopram (CELEXA) 10 MG tablet Take 10 mg by mouth daily.     clopidogrel (PLAVIX) 75 MG tablet Take 75 mg by mouth daily.     Coenzyme Q10 400 MG CAPS Take 800 mg by mouth daily.      famotidine (PEPCID) 20 MG tablet Take 20 mg by mouth daily.     hydroxyurea  (HYDREA ) 500 MG capsule Take 500mg  daily for 5 days per week 90 capsule 0   levothyroxine (SYNTHROID, LEVOTHROID) 75 MCG tablet Take 75 mcg by mouth daily before breakfast.     magnesium oxide (MAG-OX) 400 MG tablet Take 400 mg by mouth daily.     MAGNESIUM PO Take 1 tablet by mouth daily.     omega-3 acid ethyl esters (LOVAZA) 1 g capsule Take 1 g by mouth daily.      omeprazole (PRILOSEC) 20 MG capsule Take 20 mg by mouth daily before lunch.      OVER THE COUNTER MEDICATION Take 2 capsules by mouth daily. Bone Up     OVER THE COUNTER MEDICATION Take 1 tablet by mouth daily. ARTERIAL PROTECT     PREMARIN  0.45 MG tablet Take 0.45 mg by mouth daily.     Red Yeast Rice 600 MG CAPS Take 1,200 mg by mouth Nightly.     RESVERATROL PO Take by mouth daily.     Turmeric 500 MG CAPS Take 1,000 mg by mouth daily.     vitamin E 400 UNIT capsule Take 400 Units by mouth every other day.      No current facility-administered medications for this visit.     Review of Systems  Constitutional:  Negative for appetite change, chills, fatigue and fever.  HENT:   Negative for hearing loss and voice change.   Eyes:  Negative for eye problems.  Respiratory:  Negative for chest tightness and cough.   Cardiovascular:  Negative for chest pain.  Gastrointestinal:  Negative for abdominal distention, abdominal pain and blood in stool.  Endocrine: Negative for hot flashes.  Genitourinary:  Negative for difficulty urinating and frequency.   Musculoskeletal:  Negative  for arthralgias.  Skin:  Negative for itching and rash.  Neurological:  Negative for extremity weakness.  Hematological:  Negative for adenopathy.  Psychiatric/Behavioral:  Negative for confusion.    Cassandra Johns  PHYSICAL EXAMINATION: ECOG PERFORMANCE STATUS: 1 - Symptomatic but completely ambulatory  Vitals:   09/01/23 1346  BP: 114/69  Pulse: 75  Resp: 18  Temp: 97.9 F (36.6 C)  SpO2: 100%      Filed Weights   09/01/23 1346  Weight: 123 lb 12.8 oz (56.2 kg)    Physical Exam Constitutional:      General: She is not in acute distress.    Appearance: She is not diaphoretic.  HENT:     Head: Normocephalic and atraumatic.  Eyes:     General: No scleral icterus. Cardiovascular:     Rate and Rhythm: Normal rate and regular rhythm.     Heart sounds: No murmur heard. Pulmonary:  Effort: Pulmonary effort is normal. No respiratory distress.  Abdominal:     General: There is no distension.     Palpations: Abdomen is soft.     Tenderness: There is no abdominal tenderness.  Musculoskeletal:        General: Normal range of motion.     Cervical back: Normal range of motion and neck supple.  Skin:    General: Skin is warm and dry.     Findings: No erythema.  Neurological:     Mental Status: She is alert and oriented to person, place, and time. Mental status is at baseline.     Cranial Nerves: No cranial nerve deficit.     Motor: No abnormal muscle tone.  Psychiatric:        Mood and Affect: Affect normal.     LABORATORY DATA:  I have reviewed the data as listed    Latest Ref Rng & Units 09/01/2023    1:39 PM 05/02/2023    2:33 PM 02/01/2023    2:20 PM  CBC  WBC 4.0 - 10.5 K/uL 3.8  4.1  5.0   Hemoglobin 12.0 - 15.0 g/dL 87.3  87.2  87.9   Hematocrit 36.0 - 46.0 % 37.9  38.5  36.8   Platelets 150 - 400 K/uL 310  366  393        Latest Ref Rng & Units 09/01/2023    1:39 PM 05/02/2023    2:33 PM 02/01/2023    2:20 PM  CMP  Glucose 70 - 99 mg/dL 95  89  85   BUN 8 -  23 mg/dL 28  28  19    Creatinine 0.44 - 1.00 mg/dL 8.88  8.84  8.93   Sodium 135 - 145 mmol/L 135  137  140   Potassium 3.5 - 5.1 mmol/L 4.5  4.3  4.5   Chloride 98 - 111 mmol/L 99  100  103   CO2 22 - 32 mmol/L 27  28  28    Calcium 8.9 - 10.3 mg/dL 9.3  9.4  9.5   Total Protein 6.5 - 8.1 g/dL 6.9  7.0  6.7   Total Bilirubin 0.0 - 1.2 mg/dL 0.5  0.5  0.5   Alkaline Phos 38 - 126 U/L 46  44  41   AST 15 - 41 U/L 20  22  20    ALT 0 - 44 U/L 13  18  17          RADIOGRAPHIC STUDIES: I have personally reviewed the radiological images as listed and agreed with the findings in the report. No results found.

## 2023-10-13 ENCOUNTER — Encounter: Payer: Self-pay | Admitting: Oncology

## 2023-10-13 ENCOUNTER — Inpatient Hospital Stay (HOSPITAL_BASED_OUTPATIENT_CLINIC_OR_DEPARTMENT_OTHER): Admitting: Oncology

## 2023-10-13 ENCOUNTER — Inpatient Hospital Stay: Attending: Oncology

## 2023-10-13 VITALS — BP 131/60 | HR 68 | Temp 96.8°F | Resp 18 | Wt 123.9 lb

## 2023-10-13 DIAGNOSIS — D75839 Thrombocytosis, unspecified: Secondary | ICD-10-CM | POA: Insufficient documentation

## 2023-10-13 DIAGNOSIS — Z7964 Long term (current) use of myelosuppressive agent: Secondary | ICD-10-CM | POA: Diagnosis not present

## 2023-10-13 DIAGNOSIS — D473 Essential (hemorrhagic) thrombocythemia: Secondary | ICD-10-CM | POA: Diagnosis not present

## 2023-10-13 DIAGNOSIS — Z8673 Personal history of transient ischemic attack (TIA), and cerebral infarction without residual deficits: Secondary | ICD-10-CM | POA: Insufficient documentation

## 2023-10-13 LAB — CBC WITH DIFFERENTIAL (CANCER CENTER ONLY)
Abs Immature Granulocytes: 0 K/uL (ref 0.00–0.07)
Basophils Absolute: 0 K/uL (ref 0.0–0.1)
Basophils Relative: 1 %
Eosinophils Absolute: 0.1 K/uL (ref 0.0–0.5)
Eosinophils Relative: 3 %
HCT: 35.8 % — ABNORMAL LOW (ref 36.0–46.0)
Hemoglobin: 11.9 g/dL — ABNORMAL LOW (ref 12.0–15.0)
Immature Granulocytes: 0 %
Lymphocytes Relative: 37 %
Lymphs Abs: 1.5 K/uL (ref 0.7–4.0)
MCH: 36.1 pg — ABNORMAL HIGH (ref 26.0–34.0)
MCHC: 33.2 g/dL (ref 30.0–36.0)
MCV: 108.5 fL — ABNORMAL HIGH (ref 80.0–100.0)
Monocytes Absolute: 0.4 K/uL (ref 0.1–1.0)
Monocytes Relative: 11 %
Neutro Abs: 1.9 K/uL (ref 1.7–7.7)
Neutrophils Relative %: 48 %
Platelet Count: 292 K/uL (ref 150–400)
RBC: 3.3 MIL/uL — ABNORMAL LOW (ref 3.87–5.11)
RDW: 12.8 % (ref 11.5–15.5)
WBC Count: 3.9 K/uL — ABNORMAL LOW (ref 4.0–10.5)
nRBC: 0 % (ref 0.0–0.2)

## 2023-10-13 MED ORDER — HYDROXYUREA 500 MG PO CAPS: ORAL_CAPSULE | ORAL | 0 refills | Status: DC

## 2023-10-13 NOTE — Assessment & Plan Note (Addendum)
 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 Currently on hydroxyurea  500 mg 5 days Mondays through Fridays and off on weekends. Labs reviewed discussed with patient. White count slightly decreased.  Hemoglobin has decreased.  Platelet count is within normal limit. Recommend patient to decrease hydroxyurea  dosage regimen recommend patient to take hydroxyurea  500 mg on Mondays, Tuesdays, Thursdays, Fridays,  Off Wednesdays and Saturdays, Sundays. . Continue aspirin 81 mg daily.

## 2023-10-13 NOTE — Progress Notes (Signed)
 Hematology/Oncology Progress note [phone]) [phone] Fax:(336) [phone]     Reason for visit-Essential thrombocytosis, JAK2 positive, high risk.   ASSESSMENT & PLAN:  Cassandra Johns is a 83 yo F with pmh of GERD and TIA was referred to hematology clinic for further work-up of thrombocytosis.  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 Currently on hydroxyurea  500 mg 5 days Mondays through Fridays and off on weekends. Labs reviewed discussed with patient. White count slightly decreased.  Hemoglobin has decreased.  Platelet count is within normal limit. Recommend patient to decrease hydroxyurea  dosage regimen recommend patient to take hydroxyurea  500 mg on Mondays, Tuesdays, Thursdays, Fridays,  Off Wednesdays and Saturdays, Sundays. . Continue aspirin 81 mg daily.  Orders Placed This Encounter  Procedures   CBC with Differential/Platelet    Standing Status:   Future    Expected Date:   01/13/2024    Expiration Date:   04/12/2024   CMP (Cancer Center only)    Standing Status:   Future    Expected Date:   01/13/2024    Expiration Date:   04/12/2024   Follow-up 3 months We spent sufficient time to discuss many aspect of care, questions were answered to patient's satisfaction.   Zelphia Cap, MD, PhD [facility] Hematology Oncology 10/13/2023    INTERVAL HISTORY: Cassandra Johns 83 y.o. female returns for to discuss lab results for work up of thrombocytosis   SUMMARY OF HEMATOLOGIC HISTORY: Cassandra Johns is a 83 y.o. female with pmh of TIA and GERD  Patient was seen as initial visit on 09/25/2021 for work up of thrombocytosis. Patient denied fever, unexplained weight loss, shortness of breath, chest pain, lumps, change in appetite, headache, vision changes, pruritis.  Denies any history of blood clots. Reports episode of TIA in 2011.  Reports history of Raynaud's phenomenon 2-3 times a year.  Patient has  elevated platelet count dating back to Aug 2019 and has been slowly trending up with most recent of 647 in July 2023. WBC and HB is normal. Further work-up showed normal iron studies and ESR/CRP. JAK2 mutation positive.  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  Cassandra Johns had transient episode of right arm cyanosis.  Evaluated by vascular and Dopplers were negative.  Could be small vessel disease and component of vasospasm.  Cassandra Johns does report history of possible Raynaud's in her family.  Cassandra Johns has not had further event for more than 3 months.  Her platelets have been well-controlled.  Dr A discussed the case with vascular and it is okay to discontinue Plavix.  Continue with aspirin 81 mg indefinitely.    Patient has a history of TIA in 2016.  INTERVAL HISTORY:  Patient seen today for toxicity check for hydroxyurea . Patient previously followed up with Dr.Agrawal. Patient switched care to me on 09/01/2023 Extensive medical record review was performed by me  Cassandra Johns is doing well overall.  Denies any further event of hand cyanosis.  Denies any fatigue. Patient takes hydroxyurea  5 days/week.   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  MEDICATIONS:  Current Outpatient Medications  Medication  Sig Dispense Refill   acidophilus (RISAQUAD) CAPS capsule Take 1 capsule by mouth daily.     b complex vitamins tablet Take 1 tablet by mouth daily.     Beta Glucan 250 MG CAPS Take by mouth.     Cholecalciferol (VITAMIN D3) 125 MCG (5000 UT) CAPS Take  5,000 Units by mouth daily.     citalopram (CELEXA) 10 MG tablet Take 10 mg by mouth daily.     clopidogrel (PLAVIX) 75 MG tablet Take 75 mg by mouth daily.     Coenzyme Q10 400 MG CAPS Take 800 mg by mouth daily.      famotidine (PEPCID) 20 MG tablet Take 20 mg by mouth daily.     levothyroxine (SYNTHROID, LEVOTHROID) 75 MCG tablet Take 75 mcg by mouth daily before breakfast.     magnesium oxide (MAG-OX) 400 MG tablet Take 400 mg by mouth daily.     MAGNESIUM PO Take 1 tablet by mouth daily.     omega-3 acid ethyl esters (LOVAZA) 1 g capsule Take 1 g by mouth daily.      omeprazole (PRILOSEC) 20 MG capsule Take 20 mg by mouth daily before lunch.      OVER THE COUNTER MEDICATION Take 2 capsules by mouth daily. Bone Up     OVER THE COUNTER MEDICATION Take 1 tablet by mouth daily. ARTERIAL PROTECT     PREMARIN  0.45 MG tablet Take 0.45 mg by mouth daily.     Red Yeast Rice 600 MG CAPS Take 1,200 mg by mouth Nightly.     RESVERATROL PO Take by mouth daily.     Turmeric 500 MG CAPS Take 1,000 mg by mouth daily.     vitamin E 400 UNIT capsule Take 400 Units by mouth every other day.      hydroxyurea  (HYDREA ) 500 MG capsule hydroxyurea  500 mg daily for 4 days per week. Off 3 days per week. 90 capsule 0   No current facility-administered medications for this visit.     Review of Systems  Constitutional:  Negative for appetite change, chills, fatigue and fever.  HENT:   Negative for hearing loss and voice change.   Eyes:  Negative for eye problems.  Respiratory:  Negative for chest tightness and cough.   Cardiovascular:  Negative for chest pain.  Gastrointestinal:  Negative for abdominal distention, abdominal pain and blood in stool.  Endocrine: Negative for hot flashes.  Genitourinary:  Negative for difficulty urinating and frequency.   Musculoskeletal:  Negative for arthralgias.  Skin:  Negative for itching and rash.  Neurological:  Negative for extremity weakness.  Hematological:   Negative for adenopathy.  Psychiatric/Behavioral:  Negative for confusion.    Cassandra Johns  PHYSICAL EXAMINATION: ECOG PERFORMANCE STATUS: 1 - Symptomatic but completely ambulatory  Vitals:   10/13/23 1449  BP: 131/60  Pulse: 68  Resp: 18  Temp: (!) 96.8 F (36 C)  SpO2: 98%      Filed Weights   10/13/23 1449  Weight: 123 lb 14.4 oz (56.2 kg)    Physical Exam Constitutional:      General: Cassandra Johns is not in acute distress.    Appearance: Cassandra Johns is not diaphoretic.  HENT:     Head: Normocephalic and atraumatic.  Eyes:     General: No scleral icterus. Cardiovascular:     Rate and Rhythm: Normal rate and regular rhythm.     Heart sounds: No murmur heard. Pulmonary:     Effort: Pulmonary effort is normal. No respiratory  distress.  Abdominal:     General: There is no distension.     Palpations: Abdomen is soft.     Tenderness: There is no abdominal tenderness.  Musculoskeletal:        General: Normal range of motion.     Cervical back: Normal range of motion and neck supple.  Skin:    General: Skin is warm and dry.     Findings: No erythema.  Neurological:     Mental Status: Cassandra Johns is alert and oriented to person, place, and time. Mental status is at baseline.     Cranial Nerves: No cranial nerve deficit.     Motor: No abnormal muscle tone.  Psychiatric:        Mood and Affect: Affect normal.     LABORATORY DATA:  I have reviewed the data as listed    Latest Ref Rng & Units 10/13/2023    2:38 PM 09/01/2023    1:39 PM 05/02/2023    2:33 PM  CBC  WBC 4.0 - 10.5 K/uL 3.9  3.8  4.1   Hemoglobin 12.0 - 15.0 g/dL 88.0  87.3  87.2   Hematocrit 36.0 - 46.0 % 35.8  37.9  38.5   Platelets 150 - 400 K/uL 292  310  366        Latest Ref Rng & Units 09/01/2023    1:39 PM 05/02/2023    2:33 PM 02/01/2023    2:20 PM  CMP  Glucose 70 - 99 mg/dL 95  89  85   BUN 8 - 23 mg/dL 28  28  19    Creatinine 0.44 - 1.00 mg/dL 8.88  8.84  8.93   Sodium 135 - 145 mmol/L 135  137  140   Potassium 3.5  - 5.1 mmol/L 4.5  4.3  4.5   Chloride 98 - 111 mmol/L 99  100  103   CO2 22 - 32 mmol/L 27  28  28    Calcium 8.9 - 10.3 mg/dL 9.3  9.4  9.5   Total Protein 6.5 - 8.1 g/dL 6.9  7.0  6.7   Total Bilirubin 0.0 - 1.2 mg/dL 0.5  0.5  0.5   Alkaline Phos 38 - 126 U/L 46  44  41   AST 15 - 41 U/L 20  22  20    ALT 0 - 44 U/L 13  18  17          RADIOGRAPHIC STUDIES: I have personally reviewed the radiological images as listed and agreed with the findings in the report. No results found.

## 2023-11-05 ENCOUNTER — Emergency Department

## 2023-11-05 ENCOUNTER — Encounter: Payer: Self-pay | Admitting: Emergency Medicine

## 2023-11-05 ENCOUNTER — Emergency Department: Admission: EM | Admit: 2023-11-05 | Discharge: 2023-11-06 | Disposition: A | Discharge status: Home / Self Care

## 2023-11-05 ENCOUNTER — Other Ambulatory Visit: Payer: Self-pay

## 2023-11-05 DIAGNOSIS — I10 Essential (primary) hypertension: Secondary | ICD-10-CM | POA: Diagnosis not present

## 2023-11-05 DIAGNOSIS — E785 Hyperlipidemia, unspecified: Secondary | ICD-10-CM | POA: Diagnosis not present

## 2023-11-05 DIAGNOSIS — W19XXXA Unspecified fall, initial encounter: Secondary | ICD-10-CM

## 2023-11-05 DIAGNOSIS — I6782 Cerebral ischemia: Secondary | ICD-10-CM | POA: Diagnosis not present

## 2023-11-05 DIAGNOSIS — W1839XA Other fall on same level, initial encounter: Secondary | ICD-10-CM | POA: Insufficient documentation

## 2023-11-05 DIAGNOSIS — Z8673 Personal history of transient ischemic attack (TIA), and cerebral infarction without residual deficits: Secondary | ICD-10-CM | POA: Insufficient documentation

## 2023-11-05 DIAGNOSIS — R9431 Abnormal electrocardiogram [ECG] [EKG]: Secondary | ICD-10-CM | POA: Diagnosis not present

## 2023-11-05 DIAGNOSIS — D696 Thrombocytopenia, unspecified: Secondary | ICD-10-CM | POA: Insufficient documentation

## 2023-11-05 DIAGNOSIS — R42 Dizziness and giddiness: Secondary | ICD-10-CM | POA: Insufficient documentation

## 2023-11-05 LAB — COMPREHENSIVE METABOLIC PANEL WITH GFR
ALT: 12 U/L (ref 0–44)
AST: 22 U/L (ref 15–41)
Albumin: 4 g/dL (ref 3.5–5.0)
Alkaline Phosphatase: 39 U/L (ref 38–126)
Anion gap: 10 (ref 5–15)
BUN: 20 mg/dL (ref 8–23)
CO2: 29 mmol/L (ref 22–32)
Calcium: 9.3 mg/dL (ref 8.9–10.3)
Chloride: 100 mmol/L (ref 98–111)
Creatinine, Ser: 1.11 mg/dL — ABNORMAL HIGH (ref 0.44–1.00)
GFR, Estimated: 50 mL/min — ABNORMAL LOW (ref >60)
Glucose, Bld: 108 mg/dL — ABNORMAL HIGH (ref 70–99)
Potassium: 4.1 mmol/L (ref 3.5–5.1)
Sodium: 139 mmol/L (ref 135–145)
Total Bilirubin: 0.6 mg/dL (ref 0.0–1.2)
Total Protein: 7.3 g/dL (ref 6.5–8.1)

## 2023-11-05 LAB — DIFFERENTIAL
Abs Immature Granulocytes: 0.01 K/uL (ref 0.00–0.07)
Basophils Absolute: 0 K/uL (ref 0.0–0.1)
Basophils Relative: 1 %
Eosinophils Absolute: 0.1 K/uL (ref 0.0–0.5)
Eosinophils Relative: 2 %
Immature Granulocytes: 0 %
Lymphocytes Relative: 33 %
Lymphs Abs: 1.4 K/uL (ref 0.7–4.0)
Monocytes Absolute: 0.5 K/uL (ref 0.1–1.0)
Monocytes Relative: 11 %
Neutro Abs: 2.2 K/uL (ref 1.7–7.7)
Neutrophils Relative %: 53 %

## 2023-11-05 LAB — CBC
HCT: 36.8 % (ref 36.0–46.0)
Hemoglobin: 12.2 g/dL (ref 12.0–15.0)
MCH: 35.8 pg — ABNORMAL HIGH (ref 26.0–34.0)
MCHC: 33.2 g/dL (ref 30.0–36.0)
MCV: 107.9 fL — ABNORMAL HIGH (ref 80.0–100.0)
Platelets: 304 K/uL (ref 150–400)
RBC: 3.41 MIL/uL — ABNORMAL LOW (ref 3.87–5.11)
RDW: 12.7 % (ref 11.5–15.5)
WBC: 4.2 K/uL (ref 4.0–10.5)
nRBC: 0 % (ref 0.0–0.2)

## 2023-11-05 LAB — PROTIME-INR
INR: 1 (ref 0.8–1.2)
Prothrombin Time: 13.2 s (ref 11.4–15.2)

## 2023-11-05 LAB — APTT: aPTT: 27 s (ref 24–36)

## 2023-11-05 LAB — ETHANOL: Alcohol, Ethyl (B): <15 mg/dL (ref <15)

## 2023-11-05 MED ORDER — SODIUM CHLORIDE 0.9% FLUSH: 3.0000 mL | Freq: Once | INTRAVENOUS | Status: DC

## 2023-11-05 NOTE — ED Provider Notes (Signed)
 [facility] Provider Note    Event Date/Time   First MD Initiated Contact with Patient 11/05/23 2325     (approximate)   History   Fall  Patient reports putting clothes in the washer tonight, and falling around 2030 tonight.  Patient denies syncope or LOC, denies mechanical fall. Patient states a force just knocked me over.  Patient reports that she initially had difficulty getting up, but was able to get up and is ambulatory with a steady gait at this time.  Patient endorses slight dizziness, stroke scale negative.    HPI Cassandra Johns is a 83 y.o. female PMH prior stroke, hypertension, hyperlipidemia, thrombocytopenia presents for evaluation after fall - patient was standing in front of the dryer, notes that she fell over, not fully able to describe what happened but keeps repeating that a force to go over.  Denies preceding dizziness, chest pain, palpitations.  Fell backward.  Unclear if she hit her head but denies any loss of consciousness.  Notes she felt vaguely dizzy for about 2 minutes but then has been ambulatory since then.  Also had a headache that has resolved.  Otherwise has been in her usual state of health.  Not on any blood thinners. - Currently symptomatic - No recent urinary symptoms       Physical Exam   Triage Vital Signs: ED Triage Vitals  Encounter Vitals Group     BP 11/05/23 2227 (!) 167/75     Girls Systolic BP Percentile --      Girls Diastolic BP Percentile --      Boys Systolic BP Percentile --      Boys Diastolic BP Percentile --      Pulse Rate 11/05/23 2227 75     Resp 11/05/23 2227 18     Temp 11/05/23 2227 97.8 F (36.6 C)     Temp Source 11/05/23 2227 Oral     SpO2 11/05/23 2227 97 %     Weight 11/05/23 2228 122 lb (55.3 kg)     Height --      Head Circumference --      Peak Flow --      Pain Score 11/05/23 2227 0     Pain Loc --      Pain Education --      Exclude from Growth Chart --     Most recent  vital signs: Vitals:   11/06/23 0209 11/06/23 0211  BP: 135/66   Pulse: 68   Resp: 15   Temp:  98.3 F (36.8 C)  SpO2: 100%      General: Awake, no distress.  HEENT: Normocephalic, atraumatic, no midline neck pain, full range of motion CV:  Good peripheral perfusion. RRR, RP 2+ Resp:  Normal effort. CTAB Abd:  No distention. Nontender to deep palpation throughout Back:  No midline ttp Other:  Tenderness to palpation throughout bilateral upper and lower extremities.  Full range of motion of all joints.  No deformities.   ED Results / Procedures / Treatments   Labs (all labs ordered are listed, but only abnormal results are displayed) Labs Reviewed  CBC - Abnormal; Notable for the following components:      Result Value   RBC 3.41 (*)    MCV 107.9 (*)    MCH 35.8 (*)    All other components within normal limits  COMPREHENSIVE METABOLIC PANEL WITH GFR - Abnormal; Notable for the following components:   Glucose, Bld 108 (*)  Creatinine, Ser 1.11 (*)    GFR, Estimated 50 (*)    All other components within normal limits  URINALYSIS, COMPLETE (UACMP) WITH MICROSCOPIC - Abnormal; Notable for the following components:   Color, Urine STRAW (*)    APPearance CLEAR (*)    Bacteria, UA MANY (*)    All other components within normal limits  PROTIME-INR  APTT  DIFFERENTIAL  ETHANOL  TROPONIN I (HIGH SENSITIVITY)     EKG  Ecg = sinus rhythm, rate 68, no gross ST elevation or depression, no significant repolarization abnormality, normal axis, normal intervals.  No evidence of ischemia nor arrhythmia on my interpretation.   RADIOLOGY Radiology interpreted by myself and radiology report reviewed.  No acute pathology identified.    PROCEDURES:  Critical Care performed: No  Procedures   MEDICATIONS ORDERED IN ED: Medications  sodium chloride  flush (NS) 0.9 % injection 3 mL (3 mLs Intravenous Not Given 11/05/23 2306)     IMPRESSION / MDM / ASSESSMENT AND PLAN / ED  COURSE  I reviewed the triage vital signs and the nursing notes.                              DDX/MDM/AP: Differential diagnosis includes, but is not limited to, likely mechanical fall, consider underlying anemia, electrolyte abnormality, UTI could have contributed to patient falling earlier today.  Does not give a syncopal story.  Doubt transient arrhythmia.  Do not clinically suspect ACS, PE given no related cardiopulmonary symptoms other than mildly dizziness after she fell backward.  Normal neurologic exam, no clinical concern for stroke at this time.  Consider possibility of intracranial hemorrhage, skull fracture.  Plan: - CT head at triage, unremarkable - Told patient I would have preferred to have gotten a CT C-spine in addition given her age despite reassuring neck exam on my eval, offered CT C-spine which patient declined - Labs - EKG - Reassess  Patient's presentation is most consistent with acute presentation with potential threat to life or bodily function.  The patient is on the cardiac monitor to evaluate for evidence of arrhythmia and/or significant heart rate changes.  ED course below.  Workup unremarkable.  No evidence of underlying pathology nor traumatic injuries from fall.  Overall suspect likely mechanical fall, no clear precipitant and no evidence of any pathology at this time.  Plan for PMD follow-up.  ED return precautions in place.  Patient and family agree with plan.  Clinical Course as of 11/06/23 0221  Sat Nov 05, 2023  2332 CBC unremarkable  CMP unremarkable, at baseline [MM]  2332 CT head interpreted by myself, no acute pathology identified.  Radiology report reviewed, below  CTH: IMPRESSION: 1. No acute intracranial abnormality. 2. Parenchymal volume loss commensurate with the patient's age. 3. Periventricular white matter changes likely reflecting the sequela of small vessel ischemia. 4. 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    [MM]  Sun Nov 06, 2023  0039 Trop wnl [MM]  0157 Urinalysis reviewed, no pyuria, some bacteria, negative nitrite, leuk esterase--patient with no symptoms, do not clinically suspect UTI at this time. [MM]    Clinical Course User Index [MM] Clarine Ozell LABOR, MD     FINAL CLINICAL IMPRESSION(S) / ED DIAGNOSES   Final diagnoses:  Fall, initial encounter     Rx / DC Orders   ED Discharge Orders     None        Note:  This document was prepared using Dragon voice recognition software and may include unintentional dictation errors.   Clarine Ozell LABOR, MD 11/06/23 [phone]

## 2023-11-05 NOTE — ED Triage Notes (Signed)
 Patient reports putting clothes in the washer tonight, and falling around 2030 tonight.  Patient denies syncope or LOC, denies mechanical fall. Patient states a force just knocked me over.  Patient reports that she initially had difficulty getting up, but was able to get up and is ambulatory with a steady gait at this time.  Patient endorses slight dizziness, stroke scale negative.

## 2023-11-05 NOTE — ED Notes (Signed)
 Patient not a code stroke per MD Claudene.

## 2023-11-06 ENCOUNTER — Other Ambulatory Visit: Payer: Self-pay

## 2023-11-06 ENCOUNTER — Emergency Department

## 2023-11-06 DIAGNOSIS — R42 Dizziness and giddiness: Secondary | ICD-10-CM | POA: Diagnosis not present

## 2023-11-06 LAB — URINALYSIS, COMPLETE (UACMP) WITH MICROSCOPIC
Bilirubin Urine: NEGATIVE
Glucose, UA: NEGATIVE mg/dL
Hgb urine dipstick: NEGATIVE
Ketones, ur: NEGATIVE mg/dL
Leukocytes,Ua: NEGATIVE
Nitrite: NEGATIVE
Protein, ur: NEGATIVE mg/dL
Specific Gravity, Urine: 1.005 (ref 1.005–1.030)
pH: 7 (ref 5.0–8.0)

## 2023-11-06 LAB — TROPONIN I (HIGH SENSITIVITY): Troponin I (High Sensitivity): 3 ng/L (ref <18)

## 2023-11-06 NOTE — ED Notes (Signed)
 Answered call light, pt and family wants to know when can they leave, advised pt we need to collect another tube for blood work. Pt advised no, I advised pt it looks like another troponin has been added. Advised pt and family will notify RN, Powell to speak to them .

## 2023-11-06 NOTE — Discharge Instructions (Signed)
 Your evaluation in the emergency department was overall reassuring.  I am unsure as to what exactly caused your fall, but we saw no concerning findings today and no traumatic injuries from your fall.  Please do follow-up with your primary care provider for reevaluation, and return to the emergency department with any new or worsening symptoms.

## 2023-11-06 NOTE — ED Notes (Signed)
 Repeat troponin level not needed per EDP Mian.

## 2023-11-08 ENCOUNTER — Telehealth: Payer: Self-pay | Admitting: *Deleted

## 2023-11-08 NOTE — Telephone Encounter (Signed)
 Patient says that she went to the ER on Saturday night did not know if you need that information or if she needs to be seen. She was putting in clothes to wash late at night. and felt vaguely dizzy for about 2 minutes but then has been ambulatory since then .  Patient wanted you to know. If she needs an appt . Or not.I called the patient and did not answer and I left message that it looks like she has dizzines and had Headache that resolved. She was walking without issues. She can call me back  if you have any things about you and the ED let us  know. [phone]

## 2024-01-18 ENCOUNTER — Other Ambulatory Visit: Payer: Self-pay | Admitting: Oncology

## 2024-01-23 ENCOUNTER — Inpatient Hospital Stay: Admitting: Oncology

## 2024-01-23 ENCOUNTER — Encounter: Payer: Self-pay | Admitting: Oncology

## 2024-01-23 ENCOUNTER — Inpatient Hospital Stay: Attending: Oncology

## 2024-01-23 VITALS — BP 132/67 | HR 72 | Temp 97.1°F | Resp 18 | Wt 124.9 lb

## 2024-01-23 DIAGNOSIS — D473 Essential (hemorrhagic) thrombocythemia: Secondary | ICD-10-CM | POA: Diagnosis not present

## 2024-01-23 DIAGNOSIS — N1831 Chronic kidney disease, stage 3a: Secondary | ICD-10-CM

## 2024-01-23 DIAGNOSIS — T451X5A Adverse effect of antineoplastic and immunosuppressive drugs, initial encounter: Secondary | ICD-10-CM | POA: Insufficient documentation

## 2024-01-23 DIAGNOSIS — D75839 Thrombocytosis, unspecified: Secondary | ICD-10-CM | POA: Insufficient documentation

## 2024-01-23 DIAGNOSIS — D6481 Anemia due to antineoplastic chemotherapy: Secondary | ICD-10-CM

## 2024-01-23 DIAGNOSIS — N183 Chronic kidney disease, stage 3 unspecified: Secondary | ICD-10-CM | POA: Insufficient documentation

## 2024-01-23 LAB — CBC WITH DIFFERENTIAL/PLATELET
Abs Immature Granulocytes: 0.02 K/uL (ref 0.00–0.07)
Basophils Absolute: 0 K/uL (ref 0.0–0.1)
Basophils Relative: 1 %
Eosinophils Absolute: 0.2 K/uL (ref 0.0–0.5)
Eosinophils Relative: 4 %
HCT: 36.2 % (ref 36.0–46.0)
Hemoglobin: 11.8 g/dL — ABNORMAL LOW (ref 12.0–15.0)
Immature Granulocytes: 0 %
Lymphocytes Relative: 29 %
Lymphs Abs: 1.4 K/uL (ref 0.7–4.0)
MCH: 34.8 pg — ABNORMAL HIGH (ref 26.0–34.0)
MCHC: 32.6 g/dL (ref 30.0–36.0)
MCV: 106.8 fL — ABNORMAL HIGH (ref 80.0–100.0)
Monocytes Absolute: 0.5 K/uL (ref 0.1–1.0)
Monocytes Relative: 9 %
Neutro Abs: 2.8 K/uL (ref 1.7–7.7)
Neutrophils Relative %: 57 %
Platelets: 308 K/uL (ref 150–400)
RBC: 3.39 MIL/uL — ABNORMAL LOW (ref 3.87–5.11)
RDW: 12.9 % (ref 11.5–15.5)
WBC: 4.9 K/uL (ref 4.0–10.5)
nRBC: 0 % (ref 0.0–0.2)

## 2024-01-23 LAB — CMP (CANCER CENTER ONLY)
ALT: 17 U/L (ref 0–44)
AST: 20 U/L (ref 15–41)
Albumin: 3.9 g/dL (ref 3.5–5.0)
Alkaline Phosphatase: 43 U/L (ref 38–126)
Anion gap: 11 (ref 5–15)
BUN: 15 mg/dL (ref 8–23)
CO2: 27 mmol/L (ref 22–32)
Calcium: 9.4 mg/dL (ref 8.9–10.3)
Chloride: 102 mmol/L (ref 98–111)
Creatinine: 1.1 mg/dL — ABNORMAL HIGH (ref 0.44–1.00)
GFR, Estimated: 50 mL/min — ABNORMAL LOW (ref >60)
Glucose, Bld: 90 mg/dL (ref 70–99)
Potassium: 4 mmol/L (ref 3.5–5.1)
Sodium: 140 mmol/L (ref 135–145)
Total Bilirubin: 0.7 mg/dL (ref 0.0–1.2)
Total Protein: 6.7 g/dL (ref 6.5–8.1)

## 2024-01-23 NOTE — Assessment & Plan Note (Signed)
Hemoglobin is stable.   

## 2024-01-23 NOTE — Assessment & Plan Note (Signed)
 Encourage oral hydration and avoid nephrotoxins.

## 2024-01-23 NOTE — Progress Notes (Signed)
 Hematology/Oncology Progress note [phone] [phone]     Reason for visit-Essential thrombocytosis, JAK2 positive, high risk.   ASSESSMENT & PLAN:  Cassandra Johns is a 83 yo F with pmh of GERD and TIA was referred to hematology clinic for further work-up of thrombocytosis.  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 Currently on hydroxyurea  500 mg 5 days Mondays through Fridays and off on weekends. Labs reviewed discussed with patient. White count has normalized.  Hemoglobin has decreased.  Platelet count is within normal limit.  Continue hydroxyurea  regimen  hydroxyurea  500 mg on Mondays, Tuesdays, Thursdays, Fridays,  Off Wednesdays and Saturdays, Sundays. . Continue aspirin 81 mg daily.  Anemia due to antineoplastic chemotherapy Hemoglobin is stable.  CKD (chronic kidney disease) stage 3, GFR 30-59 ml/min (HCC) Encourage oral hydration and avoid nephrotoxins.    Orders Placed This Encounter  Procedures   CBC with Differential (Cancer Center Only)    Standing Status:   Future    Expected Date:   05/23/2024    Expiration Date:   08/21/2024   CMP (Cancer Center only)    Standing Status:   Future    Expected Date:   05/23/2024    Expiration Date:   08/21/2024   Follow-up 4 months We spent sufficient time to discuss many aspect of care, questions were answered to patient's satisfaction.   Zelphia Cap, MD, PhD [facility] Hematology Oncology 01/23/2024    INTERVAL HISTORY: Cassandra Johns 83 y.o. female returns for to discuss lab results for work up of thrombocytosis   SUMMARY OF HEMATOLOGIC HISTORY: Cassandra Johns is a 83 y.o. female with pmh of TIA and GERD  Patient was seen as initial visit on 09/25/2021 for work up of thrombocytosis. Patient denied fever, unexplained weight loss, shortness of breath, chest pain, lumps, change in appetite, headache, vision changes, pruritis.  Denies  any history of blood clots. Reports episode of TIA in 2011.  Reports history of Raynaud's phenomenon 2-3 times a year.  Patient has elevated platelet count dating back to Aug 2019 and has been slowly trending up with most recent of 647 in July 2023. WBC and HB is normal. Further work-up showed normal iron studies and ESR/CRP. JAK2 mutation positive.  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  She had transient episode of right arm cyanosis.  Evaluated by vascular and Dopplers were negative.  Could be small vessel disease and component of vasospasm.  She does report history of possible Raynaud's in her family.  She has not had further event for more than 3 months.  Her platelets have been well-controlled.  Dr A discussed the case with vascular and it is okay to discontinue Plavix.  Continue with aspirin 81 mg indefinitely.    Patient has a history of TIA in 2016.  INTERVAL HISTORY:  Patient seen today for toxicity check for hydroxyurea . Patient previously followed up with Dr.Agrawal. Patient switched care to me on 09/01/2023 Extensive medical record review was performed by me  She is doing well overall.  Denies any further event of hand cyanosis.  Denies any fatigue.  Patient takes hydroxyurea  4 days/week. Husband passed away in 2024/01/25.    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  MEDICATIONS:  Current Outpatient Medications  Medication Sig Dispense Refill   acidophilus (RISAQUAD) CAPS capsule Take 1 capsule by mouth  daily.     b complex vitamins tablet Take 1 tablet by mouth daily.     Beta Glucan 250 MG CAPS Take by mouth.     Cholecalciferol (VITAMIN D3) 125 MCG (5000 UT) CAPS Take 5,000 Units by mouth daily.     citalopram (CELEXA) 10 MG tablet Take 10 mg by mouth daily.     clopidogrel (PLAVIX) 75 MG tablet Take 75 mg by mouth daily.     Coenzyme Q10 400 MG CAPS Take 800 mg by mouth daily.      famotidine (PEPCID) 20 MG tablet Take 20 mg by mouth daily.     hydroxyurea  (HYDREA ) 500 MG capsule TAKE 1 CAPSULE BY MOUTH 4 DAYS A WEEK. OFF 3 DAYS A WEEK. USE AS DIRECTED. 90 capsule 0   levothyroxine (SYNTHROID, LEVOTHROID) 75 MCG tablet Take 75 mcg by mouth daily before breakfast.     magnesium oxide (MAG-OX) 400 MG tablet Take 400 mg by mouth daily.     MAGNESIUM PO Take 1 tablet by mouth daily.     omega-3 acid ethyl esters (LOVAZA) 1 g capsule Take 1 g by mouth daily.      omeprazole (PRILOSEC) 20 MG capsule Take 20 mg by mouth daily before lunch.      OVER THE COUNTER MEDICATION Take 2 capsules by mouth daily. Bone Up     OVER THE COUNTER MEDICATION Take 1 tablet by mouth daily. ARTERIAL PROTECT     PREMARIN  0.45 MG tablet Take 0.45 mg by mouth daily.     Red Yeast Rice 600 MG CAPS Take 1,200 mg by mouth Nightly.     RESVERATROL PO Take by mouth daily.     Turmeric 500 MG CAPS Take 1,000 mg by mouth daily.     vitamin E 400 UNIT capsule Take 400 Units by mouth every other day.      No current facility-administered medications for this visit.     Review of Systems  Constitutional:  Negative for appetite change, chills, fatigue and fever.  HENT:   Negative for hearing loss and voice change.   Eyes:  Negative for eye problems.  Respiratory:  Negative for chest tightness and cough.   Cardiovascular:  Negative for chest pain.  Gastrointestinal:  Negative for abdominal distention, abdominal pain and blood in stool.  Endocrine: Negative for hot flashes.  Genitourinary:  Negative for difficulty  urinating and frequency.   Musculoskeletal:  Negative for arthralgias.  Skin:  Negative for itching and rash.  Neurological:  Negative for extremity weakness.  Hematological:  Negative for adenopathy.  Psychiatric/Behavioral:  Negative for confusion.        Grieving    .  PHYSICAL EXAMINATION: ECOG PERFORMANCE STATUS: 1 - Symptomatic but completely ambulatory  Vitals:   01/23/24 1346  BP: 132/67  Pulse: 72  Resp: 18  Temp: (!) 97.1 F (36.2 C)  SpO2: 100%      Filed Weights   01/23/24 1346  Weight: 124 lb 14.4 oz (56.7 kg)    Physical Exam Constitutional:      General: She is not in acute distress.    Appearance: She is not diaphoretic.  HENT:     Head: Normocephalic and atraumatic.  Eyes:     General: No scleral icterus. Cardiovascular:     Rate and Rhythm: Normal rate and regular rhythm.     Heart sounds: No murmur heard. Pulmonary:     Effort: Pulmonary effort is normal. No respiratory distress.  Abdominal:     General: There is no distension.     Palpations: Abdomen is soft.     Tenderness: There is no abdominal tenderness.  Musculoskeletal:        General: Normal range of motion.     Cervical back: Normal range of motion and neck supple.  Skin:    General: Skin is warm and dry.     Findings: No erythema.  Neurological:     Mental Status: She is alert and oriented to person, place, and time. Mental status is at baseline.     Cranial Nerves: No cranial nerve deficit.     Motor: No abnormal muscle tone.  Psychiatric:        Mood and Affect: Affect normal.     LABORATORY DATA:  I have reviewed the data as listed    Latest Ref Rng & Units 01/23/2024    1:24 PM 11/05/2023   10:33 PM 10/13/2023    2:38 PM  CBC  WBC 4.0 - 10.5 K/uL 4.9  4.2  3.9   Hemoglobin 12.0 - 15.0 g/dL 88.1  87.7  88.0   Hematocrit 36.0 - 46.0 % 36.2  36.8  35.8   Platelets 150 - 400 K/uL 308  304  292        Latest Ref Rng & Units 01/23/2024    1:23 PM 11/05/2023   10:33 PM  09/01/2023    1:39 PM  CMP  Glucose 70 - 99 mg/dL 90  891  95   BUN 8 - 23 mg/dL 15  20  28    Creatinine 0.44 - 1.00 mg/dL 8.89  8.88  8.88   Sodium 135 - 145 mmol/L 140  139  135   Potassium 3.5 - 5.1 mmol/L 4.0  4.1  4.5   Chloride 98 - 111 mmol/L 102  100  99   CO2 22 - 32 mmol/L 27  29  27    Calcium 8.9 - 10.3 mg/dL 9.4  9.3  9.3   Total Protein 6.5 - 8.1 g/dL 6.7  7.3  6.9   Total Bilirubin 0.0 - 1.2 mg/dL 0.7  0.6  0.5   Alkaline Phos 38 - 126 U/L 43  39  46   AST 15 - 41 U/L 20  22  20    ALT 0 - 44 U/L 17  12  13          RADIOGRAPHIC STUDIES: I have personally reviewed the radiological images as listed and agreed with the findings in the report. No results found.

## 2024-01-23 NOTE — Assessment & Plan Note (Addendum)
 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 Currently on hydroxyurea  500 mg 5 days Mondays through Fridays and off on weekends. Labs reviewed discussed with patient. White count has normalized.  Hemoglobin has decreased.  Platelet count is within normal limit.  Continue hydroxyurea  regimen  hydroxyurea  500 mg on Mondays, Tuesdays, Thursdays, Fridays,  Off Wednesdays and Saturdays, Sundays. . Continue aspirin 81 mg daily.

## 2024-01-24 ENCOUNTER — Other Ambulatory Visit

## 2024-01-24 ENCOUNTER — Ambulatory Visit: Admitting: Oncology

## 2024-02-06 ENCOUNTER — Other Ambulatory Visit: Payer: Self-pay | Admitting: Cardiology

## 2024-02-06 DIAGNOSIS — Z8673 Personal history of transient ischemic attack (TIA), and cerebral infarction without residual deficits: Secondary | ICD-10-CM

## 2024-02-06 DIAGNOSIS — R0609 Other forms of dyspnea: Secondary | ICD-10-CM

## 2024-02-06 DIAGNOSIS — Z8249 Family history of ischemic heart disease and other diseases of the circulatory system: Secondary | ICD-10-CM

## 2024-02-06 DIAGNOSIS — I2510 Atherosclerotic heart disease of native coronary artery without angina pectoris: Secondary | ICD-10-CM

## 2024-02-28 ENCOUNTER — Encounter (HOSPITAL_COMMUNITY): Payer: Self-pay

## 2024-03-01 ENCOUNTER — Ambulatory Visit
Admission: RE | Admit: 2024-03-01 | Discharge: 2024-03-01 | Disposition: A | Discharge status: Home / Self Care | Source: Ambulatory Visit | Attending: Cardiology | Admitting: Cardiology

## 2024-03-01 ENCOUNTER — Other Ambulatory Visit: Payer: Self-pay | Admitting: Cardiology

## 2024-03-01 DIAGNOSIS — I25118 Atherosclerotic heart disease of native coronary artery with other forms of angina pectoris: Secondary | ICD-10-CM

## 2024-03-01 DIAGNOSIS — R0609 Other forms of dyspnea: Secondary | ICD-10-CM | POA: Insufficient documentation

## 2024-03-01 DIAGNOSIS — I2584 Coronary atherosclerosis due to calcified coronary lesion: Secondary | ICD-10-CM | POA: Diagnosis present

## 2024-03-01 DIAGNOSIS — Z8673 Personal history of transient ischemic attack (TIA), and cerebral infarction without residual deficits: Secondary | ICD-10-CM | POA: Diagnosis present

## 2024-03-01 DIAGNOSIS — R931 Abnormal findings on diagnostic imaging of heart and coronary circulation: Secondary | ICD-10-CM | POA: Insufficient documentation

## 2024-03-01 DIAGNOSIS — Z8249 Family history of ischemic heart disease and other diseases of the circulatory system: Secondary | ICD-10-CM | POA: Insufficient documentation

## 2024-03-01 DIAGNOSIS — I2510 Atherosclerotic heart disease of native coronary artery without angina pectoris: Secondary | ICD-10-CM | POA: Insufficient documentation

## 2024-03-01 MED ORDER — NITROGLYCERIN 0.4 MG SL SUBL
0.8000 mg | SUBLINGUAL_TABLET | Freq: Once | SUBLINGUAL | Status: AC
  Administered 2024-03-01: 0.8 mg via SUBLINGUAL

## 2024-03-01 MED ORDER — IOHEXOL 350 MG/ML SOLN
100.0000 mL | Freq: Once | INTRAVENOUS | Status: AC | PRN
  Administered 2024-03-01: 100 mL via INTRAVENOUS

## 2024-03-01 MED ORDER — METOPROLOL TARTRATE 5 MG/5ML IV SOLN: 10.0000 mg | Freq: Once | INTRAVENOUS | Status: DC | PRN

## 2024-03-01 MED ORDER — DILTIAZEM HCL 25 MG/5ML IV SOLN: 10.0000 mg | INTRAVENOUS | Status: DC | PRN

## 2024-03-01 NOTE — Progress Notes (Signed)
 Patient tolerated procedure well. W/C to lobby.  Ambulate w/o difficulty. Denies light headedness or being dizzy. Encouraged to drink extra water today and reasoning explained. Verbalized understanding. All questions answered. ABC intact. No further needs. Discharge from procedure area w/o issues.

## 2024-03-05 ENCOUNTER — Other Ambulatory Visit: Payer: Self-pay | Admitting: Cardiology

## 2024-03-05 DIAGNOSIS — Z8673 Personal history of transient ischemic attack (TIA), and cerebral infarction without residual deficits: Secondary | ICD-10-CM

## 2024-03-05 DIAGNOSIS — I2510 Atherosclerotic heart disease of native coronary artery without angina pectoris: Secondary | ICD-10-CM

## 2024-03-05 DIAGNOSIS — R0609 Other forms of dyspnea: Secondary | ICD-10-CM

## 2024-03-05 DIAGNOSIS — R931 Abnormal findings on diagnostic imaging of heart and coronary circulation: Secondary | ICD-10-CM

## 2024-03-05 DIAGNOSIS — Z8249 Family history of ischemic heart disease and other diseases of the circulatory system: Secondary | ICD-10-CM

## 2024-03-07 ENCOUNTER — Telehealth: Payer: Self-pay | Admitting: *Deleted

## 2024-03-07 DIAGNOSIS — J069 Acute upper respiratory infection, unspecified: Secondary | ICD-10-CM

## 2024-03-07 DIAGNOSIS — D473 Essential (hemorrhagic) thrombocythemia: Secondary | ICD-10-CM

## 2024-03-07 NOTE — Telephone Encounter (Signed)
 Caller verified using pt's full name and dob prior to discussing PHI        NURSING SYMPTOM TRIAGE ASSESSMENT & DISPOSITION  Mode of interaction:  Telephone Date of symptom triage interaction:  03/07/2024 Time of symptom triage interaction:  [id number]  Cancer diagnosis:  Essential thrombocythemia (HCC)   Oral chemotherapy:  On Hydrea  500 mg; last dosage taken Tuesday   Symptom Triage Protocol:  Flu-Like Symptoms   History of the problem:  Flu-Like Symptoms  Current symptoms: Chills, Myalgia, Headache, Cough, non-productive, Poor appetite, Fatigue, and sore throat  Onset: 03/06/2024  Duration: Acute onset  Current temperature:  96.6 Route taken:  under tongue  (Had not checked temp. I asked patient to check temp during phone call)  Relieving factors: none  Provoking factors: Symptoms of cough worse at night  Changes in ADLs: none     Additional commentary: Patient requesting antibiotics. She is scheduled to have heart cath next week.   Taking Nyquil severe cold and flu, which she had one dose at 1130 am today and she is using otc cough drops.       Triage Nurse Guidance Severity Index  Temperature higher than 102F (38.9C) without suspected neutropenia Severe flu-like symptom cluster, limiting ability to carry out activities of daily living Change in mental status Seek emergency care.  Symptoms not relieved by current homecare instructions within 24 hours Seek urgent care within 24 hours.  If flu-like syndrome is expected from the current therapy Follow homecare instructions. Notify MD if no improvement.     Patient Instruction  Disclaimer:  Patient specific and [employer] instruction provided verbally and sent through MyChart for active users.    Fever (non-neutropenic) Use nonsteroidal anti-inflammatory drugs such as acetaminophen  or ibupro-fen unless contraindicated. Aspirin can also be used by adults if platelet count is above 50,000/mm3 Try tepid baths or ice packs  as tolerated. Maintain hydration.  Chills For severe chills, a prescription for a narcotic such as meperidine or hydro-morphone may be necessary. Use comfort measures such as warm clothing, blankets, or a hot water bottle (use caution in areas treated with radiation therapy and risk of burns).  Myalgia and headache Use nonsteroidal anti-inflammatory drugs such as acetaminophen  or ibuprofen  unless contraindicated. Opioids and prednisone may be prescribed. Apply heat or cold on muscles. Apply a cool cloth on forehead. Rest in a quiet, dimly lit room. Apply heat and massage for headaches at the back of the head.  Malaise/fatigue Balance periods of activity with rest periods. Eat a balanced diet.  Cough Use an antitussive such as codeine, dextromethorphan, or benzonatate. Use a decongestant if needed. Use an antihistamine if experiencing a runny nose.  Use a humidifier if experiencing a dry cough.  Seek Emergency Care Immediately if Any of the Following Occurs: Elevated temperature for more than three days Vomiting Seizures Changes in vision Change in mental status  Teach Back Method used:  Yes    History of the problem:  Cough  Attempt to obtain patient reported VS: Does not have any way to check her vitals other than her temp at home.   Adventitious respiratory sounds heard by phone: none  Associated symptoms: Non-productive cough  Patient reported cyanosis or pallor: No  Timing of symptoms: Acute- started yesterday  Aggravating factors: Sore throat  Are these acute or chronic symptoms: acute  Potential environmental irritants: no  Potential exposure to illness: none  Changes in ADLs: no     Smoking History: Never smoker  Additional commentary:      Triage Nurse Guidance Signs and Symptoms Action  Sudden, unexpected increase in dyspnea at rest Chest pain Frothy pink sputum or gross hemoptysis Facial swelling Change in mental status Seek emergency care  immediately.   Increasing dyspnea with activity Non-neutropenic fever Increased edema or swelling Change in cough or sputum production Uncontrollable cough Wheezing, rhonchi, or crackles Seek medical care within 24 hours.   Subacute or chronic cough, lasting more than three weeks Follow homecare instructions and seek medical care if no improvement within 24-48 hours.     Patient Instruction  Disclaimer:  Patient specific and [employer] instruction provided verbally and sent through MyChart for active users.    Comply with medical therapy (e.g., antibiotics, antitussives, bronchodilators, opioids, proton pump inhibitors), respiratory treatment, and oxygen, as prescribed. Drink plenty (1-2 L) of fluids (unless restricted because of cardiac dysfunction or kidney disease) to help thin out secretions (unless underlying congestive heart failure is present). Avoid precipitating factors that worsen cough (e.g., perfumes, tobacco smoke, cold or dry air). Consider use of a warm humidifier (cold humidified air may lead to broncho-spasm). Monitor for fever or any sign of infection, and avoid contact with individuals who are sick.  Report the Following Problems: Fever (temperature higher than 101.47F [38.6C], or 100.642F [38.1C] if receiving chemotherapy) Change in sputum production (color, hemoptysis)  Swelling of the feet or hands  Unrelieved heartburn symptoms, if applicable  Seek Emergency Care Immediately if Any of the Following Occurs: Worsening dyspnea, especially if accompanied by chest pain or gross hemoptysis  Increased work of breathing (elevated respiratory or heart rate)  Teach Back Method used:  Yes      History of the problem:  Fatigue Severity: 0-10 scale with 0 as not severe and 10 as unimaginably severe 1 - 3 = Mild  Onset: 03/07/2024  Duration: Acute onset  Relieving factors: no  Intensifying factors: no  Changes in ADLs: no  Associated symptoms: Infection symptoms,  see flu-like symptoms assessment above     Additional commentary:     Triage Nurse Guidance Signs and Symptoms Action  Unable to wake up Seek emergency care. Call an ambulance immediately.  Severe fatigue that is disabling; patient is bedridden. Temperature higher than 100.642F (38C) with suspected neutropenia Adverse reaction to psychostimulant (e.g., methylpheni-date, modafinil, prednisone, dexamethasone ) Seek emergency care.   Severe fatigue or loss of ability to perform some activities Dizziness Temperature higher than 100.642F (38C) without sus-pected neutropenia Schedule office visit in 24-48 hours.   Moderate fatigue or difficulty performing some activities of daily living Follow homecare instruc-tions. Notify MD if no improvement.  Increased fatigue over baseline but not altering daily life-style Follow homecare instruc-tions. Notify MD if no improvement.    Patient Instruction  Disclaimer:  Patient specific and [employer] instruction provided verbally and sent through MyChart for active users.    Report the Following Problems: Blood in urine or stool Weight loss Fever (temperature above the normal or baseline temperature) without neutropenia Inability to perform activities of daily living Inability to conceptualize thoughts  Seek Emergency Care Immediately if Any of the Following Occurs: Fainting Unconsciousness Temperature higher than 100.642F (38C) with suspected neutropenia  Participate in the appropriate and optimal physical activity/exercise for the individual: Start slowly (e.g., walking, gentle stretching, yoga). Try short periods of exercise with frequent rest breaks. Avoid uneven surfaces. Avoid activity that puts you at risk for falls or injury. Aim for daily physical activity.  If able, include endurance activities (e.g., walking,  swimming, cycling) and resis-tance training (e.g., weights, resistance bands).  Seek a physical therapist or exercise  specialist for assessment and an exercise prescription.  Exercise is recommended with caution in patients with the following:  Bone metastases Thrombocytopenia Anemia Fever or active infection Physical limitations secondary to metastases or comorbid conditions Safety issues (risk of falls)  Manage stress through various means: In-person or online support groups Expressive writing, journaling Supportive counseling Relaxation breathing Progressive muscle relaxation Mindfulness-based stress reduction Meditation  Practice energy conservation: Prioritize important tasks and eliminate or delegate others. Keep frequently used items within reach.  Do not stand too long. Sit when preparing meals, washing dishes, ironing, etc.  Alternate periods of activity and rest throughout the day.  Plan ahead, as rushing uses energy.  Keep an activity journal for a few weeks to identify patterns of energy and fatigue. Use adaptive devices such as a jar opener, reaching or grabbing tool, shower chair, or bedside commode.  Optimize sleep quality: Cognitive behavioral therapy can address negative thought patterns and behav-iors that interfere with sleep. Sleep restriction: Match the time in bed with sleep requirements.  Avoid caffeine at least six to eight hours before bedtime.  Stop smoking.  Avoid alcohol and high-sugar foods in the evening.  Use the bedroom only for sleep and sex.  Limit daytime naps to 30 minutes.  Establish a routine prior to sleep that promotes relaxation.  Create a conducive sleep environment (dark, quiet, and comfortable).  Avoid gaming, watching TV, and using a computer or cell phone late at night.  Encourage a balanced diet and adequate intake of fluids, electrolytes, calories, protein, carbohydrates, fat, vitamins, and minerals.  Teach Back Method used:  Yes      Provider Consulted  Provider name and credentials: Msg sent to Dr. Babara and [facility]  Provider instruction: Per  Dr. Babara- recommend [facility] apt given pt is on Hydrea      Nurse Triage Priority:  Urgent (obtain medical orders as indicated with instruction for 8-24 hour or sooner follow-up)  Barriers to Care:  None identified  Nurse Triage Disposition:  In-person follow-up visit:  scheduled with Morna, NP. V/o Morna- lab/-[facility] with cbc, metc, and covid/flu/rsv swab testing. Patient agreeable to come tomorrow at 1015 for labs and see Morna, NP.   Protocol Source:  Ruthine HERO., & Eldonna CANDIE Pee.). (2019). Telephone triage for oncology nurses (3rd ed.). Oncology Nursing [employer].

## 2024-03-08 ENCOUNTER — Inpatient Hospital Stay: Admitting: Nurse Practitioner

## 2024-03-08 ENCOUNTER — Encounter: Payer: Self-pay | Admitting: Nurse Practitioner

## 2024-03-08 ENCOUNTER — Ambulatory Visit

## 2024-03-08 ENCOUNTER — Inpatient Hospital Stay: Attending: Oncology

## 2024-03-08 ENCOUNTER — Other Ambulatory Visit: Payer: Self-pay

## 2024-03-08 VITALS — BP 135/82 | HR 80 | Temp 99.6°F

## 2024-03-08 DIAGNOSIS — D75839 Thrombocytosis, unspecified: Secondary | ICD-10-CM | POA: Diagnosis not present

## 2024-03-08 DIAGNOSIS — U071 COVID-19: Secondary | ICD-10-CM | POA: Insufficient documentation

## 2024-03-08 DIAGNOSIS — J069 Acute upper respiratory infection, unspecified: Secondary | ICD-10-CM

## 2024-03-08 DIAGNOSIS — R197 Diarrhea, unspecified: Secondary | ICD-10-CM | POA: Diagnosis not present

## 2024-03-08 DIAGNOSIS — D473 Essential (hemorrhagic) thrombocythemia: Secondary | ICD-10-CM

## 2024-03-08 DIAGNOSIS — Z79899 Other long term (current) drug therapy: Secondary | ICD-10-CM | POA: Insufficient documentation

## 2024-03-08 DIAGNOSIS — R509 Fever, unspecified: Secondary | ICD-10-CM | POA: Diagnosis present

## 2024-03-08 LAB — CBC WITH DIFFERENTIAL (CANCER CENTER ONLY)
Abs Immature Granulocytes: 0.02 K/uL (ref 0.00–0.07)
Basophils Absolute: 0 K/uL (ref 0.0–0.1)
Basophils Relative: 0 %
Eosinophils Absolute: 0 K/uL (ref 0.0–0.5)
Eosinophils Relative: 0 %
HCT: 38.5 % (ref 36.0–46.0)
Hemoglobin: 12.9 g/dL (ref 12.0–15.0)
Immature Granulocytes: 0 %
Lymphocytes Relative: 14 %
Lymphs Abs: 0.6 K/uL — ABNORMAL LOW (ref 0.7–4.0)
MCH: 35.7 pg — ABNORMAL HIGH (ref 26.0–34.0)
MCHC: 33.5 g/dL (ref 30.0–36.0)
MCV: 106.6 fL — ABNORMAL HIGH (ref 80.0–100.0)
Monocytes Absolute: 0.8 K/uL (ref 0.1–1.0)
Monocytes Relative: 18 %
Neutro Abs: 3.1 K/uL (ref 1.7–7.7)
Neutrophils Relative %: 68 %
Platelet Count: 268 K/uL (ref 150–400)
RBC: 3.61 MIL/uL — ABNORMAL LOW (ref 3.87–5.11)
RDW: 13 % (ref 11.5–15.5)
WBC Count: 4.6 K/uL (ref 4.0–10.5)
nRBC: 0 % (ref 0.0–0.2)

## 2024-03-08 LAB — CMP (CANCER CENTER ONLY)
ALT: 16 U/L (ref 0–44)
AST: 27 U/L (ref 15–41)
Albumin: 4.5 g/dL (ref 3.5–5.0)
Alkaline Phosphatase: 57 U/L (ref 38–126)
Anion gap: 11 (ref 5–15)
BUN: 19 mg/dL (ref 8–23)
CO2: 27 mmol/L (ref 22–32)
Calcium: 9.7 mg/dL (ref 8.9–10.3)
Chloride: 97 mmol/L — ABNORMAL LOW (ref 98–111)
Creatinine: 1.06 mg/dL — ABNORMAL HIGH (ref 0.44–1.00)
GFR, Estimated: 53 mL/min — ABNORMAL LOW
Glucose, Bld: 96 mg/dL (ref 70–99)
Potassium: 3.9 mmol/L (ref 3.5–5.1)
Sodium: 135 mmol/L (ref 135–145)
Total Bilirubin: 0.3 mg/dL (ref 0.0–1.2)
Total Protein: 7.4 g/dL (ref 6.5–8.1)

## 2024-03-08 LAB — RESP PANEL BY RT-PCR (RSV, FLU A&B, COVID)  RVPGX2
Influenza A by PCR: NEGATIVE
Influenza B by PCR: NEGATIVE
Resp Syncytial Virus by PCR: NEGATIVE
SARS Coronavirus 2 by RT PCR: POSITIVE — AB

## 2024-03-08 MED ORDER — MOLNUPIRAVIR EUA 200MG CAPSULE: 4.0000 | ORAL_CAPSULE | Freq: Two times a day (BID) | ORAL | 0 refills | Status: AC

## 2024-03-08 NOTE — Progress Notes (Signed)
 "                                Hematology/Oncology Progress note [phone] [phone]     Reason for visit-thrombocytosis, JAK2 positive, high risk. Low grade fever/body aches, cough, general malaise    INTERVAL HISTORY: Cassandra Johns 84 y.o. 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  She continues Hydrea  every other day for thrombocytosis.    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  She had transient episode of right arm cyanosis.  Evaluated by vascular and Dopplers were negative.  Could be small vessel disease and component of vasospasm.  She does report history of possible Raynaud's in her family.  She has not had further event for more than 3 months.  Her platelets have been well-controlled.  Dr A discussed the case with vascular and it is okay to discontinue Plavix.  Continue with aspirin 81 mg indefinitely.    Patient has a history of TIA in 2016.  INTERVAL HISTORY:  She developed a non-productive cough with a tickling sensation in the posterior pharynx, mild fever, and malaise beginning the morning prior to presentation. Body aches were present initially but resolved by the day of the appointment.  She denies chills,  otalgia, nasal congestion, or significant rhinorrhea, though she describes a sensation of pharyngeal drainage. She denies dyspnea except for mild symptoms upon awakening, which she attributes to poor sleep.  She has not eaten due to malaise and had a single episode of diarrhea prior to arrival. She denies nausea or emesis.  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  MEDICATIONS:  Current Outpatient Medications  Medication Sig Dispense Refill   acidophilus (RISAQUAD) CAPS capsule Take 1 capsule by mouth daily.     b complex vitamins tablet Take 1 tablet by mouth daily.     Beta Glucan 250 MG CAPS Take by mouth.     Cholecalciferol (VITAMIN D3) 125 MCG (5000 UT) CAPS Take 5,000 Units by mouth daily.     citalopram (CELEXA) 10 MG tablet Take 10 mg by mouth daily.     clopidogrel (PLAVIX) 75 MG tablet Take 75 mg by mouth daily.     Coenzyme Q10 400 MG CAPS Take 800 mg by mouth daily.      famotidine (PEPCID) 20 MG tablet Take 20 mg by mouth daily.     hydroxyurea  (HYDREA ) 500 MG capsule TAKE 1 CAPSULE BY MOUTH 4 DAYS A WEEK. OFF 3 DAYS A WEEK. USE AS DIRECTED. 90 capsule 0   levothyroxine (SYNTHROID, LEVOTHROID) 75 MCG tablet Take 75 mcg by mouth daily before breakfast.     magnesium oxide (MAG-OX) 400 MG tablet Take 400 mg by mouth daily.  MAGNESIUM PO Take 1 tablet by mouth daily.     metoprolol  tartrate (LOPRESSOR ) 50 MG tablet Take 50 mg by mouth 2 (two) times daily.     omega-3 acid ethyl esters (LOVAZA) 1 g capsule Take 1 g by mouth daily.      omeprazole (PRILOSEC) 20 MG capsule Take 20 mg by mouth daily before lunch.      OVER THE COUNTER MEDICATION Take 2 capsules by mouth daily. Bone Up     OVER THE COUNTER MEDICATION Take 1 tablet by mouth daily. ARTERIAL PROTECT     PREMARIN  0.45 MG tablet Take 0.45 mg by mouth daily.     Red Yeast Rice 600 MG CAPS Take 1,200 mg by mouth Nightly.     RESVERATROL PO Take by  mouth daily.     Turmeric 500 MG CAPS Take 1,000 mg by mouth daily.     vitamin E 400 UNIT capsule Take 400 Units by mouth every other day.      No current facility-administered medications for this visit.     Review of Systems  Constitutional:  Positive for appetite change, chills, fatigue and fever.  HENT:   Negative for hearing loss and voice change.   Eyes:  Negative for eye problems.  Respiratory:  Positive for cough. Negative for chest tightness.   Cardiovascular:  Negative for chest pain.  Gastrointestinal:  Negative for abdominal distention, abdominal pain and blood in stool.  Endocrine: Negative for hot flashes.  Genitourinary:  Negative for difficulty urinating and frequency.   Musculoskeletal:  Negative for arthralgias.  Skin:  Negative for itching and rash.  Neurological:  Negative for extremity weakness.  Hematological:  Negative for adenopathy.  Psychiatric/Behavioral:  Negative for confusion.        Grieving    .  PHYSICAL EXAMINATION: ECOG PERFORMANCE STATUS: 1 - Symptomatic but completely ambulatory  Vitals:   03/08/24 1034  BP: 135/82  Pulse: 80  Temp: 99.6 F (37.6 C)  SpO2: 98%       There were no vitals filed for this visit.   Physical Exam Constitutional:      General: She is not in acute distress.    Appearance: She is ill-appearing. She is not diaphoretic.  HENT:     Head: Normocephalic and atraumatic.     Right Ear: Tympanic membrane, ear canal and external ear normal.     Left Ear: Tympanic membrane, ear canal and external ear normal.     Nose: Rhinorrhea present. No congestion.  Eyes:     General: No scleral icterus. Cardiovascular:     Rate and Rhythm: Normal rate and regular rhythm.     Heart sounds: No murmur heard. Pulmonary:     Effort: Pulmonary effort is normal. No respiratory distress.     Breath sounds: No wheezing or rhonchi.  Chest:     Chest wall: No tenderness.  Abdominal:     General: There is no distension.      Palpations: Abdomen is soft.     Tenderness: There is no abdominal tenderness.  Musculoskeletal:        General: No swelling. Normal range of motion.     Cervical back: Normal range of motion and neck supple.  Skin:    General: Skin is warm and dry.     Findings: No erythema.  Neurological:     Mental Status: She is alert and oriented to person, place, and time. Mental status is at baseline.  Cranial Nerves: No cranial nerve deficit.     Motor: No abnormal muscle tone.  Psychiatric:        Mood and Affect: Affect normal.     LABORATORY DATA:  I have reviewed the data as listed    Latest Ref Rng & Units 03/08/2024   10:23 AM 01/23/2024    1:24 PM 11/05/2023   10:33 PM  CBC  WBC 4.0 - 10.5 K/uL 4.6  4.9  4.2   Hemoglobin 12.0 - 15.0 g/dL 87.0  88.1  87.7   Hematocrit 36.0 - 46.0 % 38.5  36.2  36.8   Platelets 150 - 400 K/uL 268  308  304        Latest Ref Rng & Units 03/08/2024   10:23 AM 01/23/2024    1:23 PM 11/05/2023   10:33 PM  CMP  Glucose 70 - 99 mg/dL 96  90  891   BUN 8 - 23 mg/dL 19  15  20    Creatinine 0.44 - 1.00 mg/dL 8.93  8.89  8.88   Sodium 135 - 145 mmol/L 135  140  139   Potassium 3.5 - 5.1 mmol/L 3.9  4.0  4.1   Chloride 98 - 111 mmol/L 97  102  100   CO2 22 - 32 mmol/L 27  27  29    Calcium 8.9 - 10.3 mg/dL 9.7  9.4  9.3   Total Protein 6.5 - 8.1 g/dL 7.4  6.7  7.3   Total Bilirubin 0.0 - 1.2 mg/dL 0.3  0.7  0.6   Alkaline Phos 38 - 126 U/L 57  43  39   AST 15 - 41 U/L 27  20  22    ALT 0 - 44 U/L 16  17  12         Component Ref Range & Units (hover) 10:39  SARS Coronavirus 2 by RT PCR POSITIVE Abnormal   Comment: (NOTE) SARS-CoV-2 target nucleic acids are DETECTED.  The SARS-CoV-2 RNA is generally detectable in upper respiratory specimens during the acute phase of infection. Positive results are indicative of the presence of the identified virus, but do not rule out bacterial infection or co-infection with other pathogens not detected by the  test. Clinical correlation with patient history and other diagnostic information is necessary to determine patient infection status. The expected result is Negative.  Fact Sheet for Patients: bloggercourse.com  Fact Sheet for Healthcare Providers: seriousbroker.it  This test is not yet approved or cleared by the [place]  FDA and has been authorized for detection and/or diagnosis of SARS-CoV-2 by FDA under an Emergency Use Authorization (EUA).  This EUA will remain in effect (meaning this test can be used) for the duration of the COVID-19 declaration under Section 564(b)(1) of the Act, 21 U.S.C. section 360bbb-3(b)(1), unless the authorization is terminated or revoked sooner.   Influenza A by PCR NEGATIVE  Influenza B by PCR NEGATIVE  Comment: (NOTE) The Xpert Xpress SARS-CoV-2/FLU/RSV plus assay is intended as an aid in the diagnosis of influenza from Nasopharyngeal swab specimens and should not be used as a sole basis for treatment. Nasal washings and aspirates are unacceptable for Xpert Xpress SARS-CoV-2/FLU/RSV testing.  Fact Sheet for Patients: bloggercourse.com  Fact Sheet for Healthcare Providers: seriousbroker.it  This test is not yet approved or cleared by the [place]  FDA and has been authorized for detection and/or diagnosis of SARS-CoV-2 by FDA under an Emergency Use Authorization (EUA). This EUA will remain in effect (meaning this test can  be used) for the duration of the COVID-19 declaration under Section 564(b)(1) of the Act, 21 U.S.C. section 360bbb-3(b)(1), unless the authorization is terminated or revoked.   Resp Syncytial Virus by PCR NEGATIVE        RADIOGRAPHIC STUDIES: I have personally reviewed the radiological images as listed and agreed with the findings in the report. No results found.  Assessment and Plan  COVID 19 Clinical  presentation and community prevalence suggest viral etiology. Normal platelet and leukocyte counts argue against severe bacterial infection. - Ordered respiratory viral panel (COVID-19, influenza, RSV). Resulted positive for covid  - Ordered comprehensive metabolic panel to assess renal function and electrolytes. stable - Advised rest, hydration, and to maintain oral intake as tolerated. - Planned to communicate results of diagnostic studies upon availability. - Provided conditional plan for repeat chest radiograph if symptoms worsen. - Confirmed with Alyson in speciality pharmacy no drug interaction for molnupiravir  and hydroxyurea  rx sent to pharmacy for molnupiravir  patient aware.  Thrombocytosis  Managed with hydroxyurea . Platelet count within target range. - Reviewed current management with hydroxyurea . - Confirmed platelet count within therapeutic range on recent laboratory evaluation. "

## 2024-03-14 DIAGNOSIS — R931 Abnormal findings on diagnostic imaging of heart and coronary circulation: Secondary | ICD-10-CM

## 2024-03-28 ENCOUNTER — Other Ambulatory Visit: Payer: Self-pay

## 2024-03-28 ENCOUNTER — Encounter: Admission: RE | Disposition: A | Discharge status: Home / Self Care | Payer: Self-pay | Source: Home / Self Care

## 2024-03-28 ENCOUNTER — Ambulatory Visit: Admission: RE | Admit: 2024-03-28 | Discharge: 2024-03-28 | Disposition: A | Discharge status: Home / Self Care

## 2024-03-28 DIAGNOSIS — Z8249 Family history of ischemic heart disease and other diseases of the circulatory system: Secondary | ICD-10-CM | POA: Insufficient documentation

## 2024-03-28 DIAGNOSIS — E785 Hyperlipidemia, unspecified: Secondary | ICD-10-CM | POA: Insufficient documentation

## 2024-03-28 DIAGNOSIS — R931 Abnormal findings on diagnostic imaging of heart and coronary circulation: Secondary | ICD-10-CM | POA: Insufficient documentation

## 2024-03-28 DIAGNOSIS — Z7982 Long term (current) use of aspirin: Secondary | ICD-10-CM | POA: Insufficient documentation

## 2024-03-28 DIAGNOSIS — I2510 Atherosclerotic heart disease of native coronary artery without angina pectoris: Secondary | ICD-10-CM | POA: Insufficient documentation

## 2024-03-28 DIAGNOSIS — Z7902 Long term (current) use of antithrombotics/antiplatelets: Secondary | ICD-10-CM | POA: Insufficient documentation

## 2024-03-28 DIAGNOSIS — Z8673 Personal history of transient ischemic attack (TIA), and cerebral infarction without residual deficits: Secondary | ICD-10-CM | POA: Insufficient documentation

## 2024-03-28 MED ORDER — FREE WATER: 500.0000 mL | Freq: Once | Status: DC

## 2024-03-28 MED ORDER — SODIUM CHLORIDE 0.9% FLUSH: 3.0000 mL | INTRAVENOUS | Status: DC | PRN

## 2024-03-28 MED ORDER — HEPARIN SODIUM (PORCINE) 1000 UNIT/ML IJ SOLN
INTRAMUSCULAR | Status: DC | PRN
  Administered 2024-03-28: 5000 [IU] via INTRAVENOUS

## 2024-03-28 MED ORDER — IOHEXOL 300 MG/ML  SOLN
INTRAMUSCULAR | Status: DC | PRN
  Administered 2024-03-28: 58 mL

## 2024-03-28 MED ORDER — VERAPAMIL HCL 2.5 MG/ML IV SOLN
INTRAVENOUS | Status: DC | PRN
  Administered 2024-03-28: 2.5 mg via INTRA_ARTERIAL

## 2024-03-28 MED ORDER — HEPARIN SODIUM (PORCINE) 1000 UNIT/ML IJ SOLN
INTRAMUSCULAR | Status: AC
  Filled 2024-03-28: qty 10

## 2024-03-28 MED ORDER — SODIUM CHLORIDE 0.9% FLUSH: 3.0000 mL | Freq: Two times a day (BID) | INTRAVENOUS | Status: DC

## 2024-03-28 MED ORDER — MIDAZOLAM HCL 2 MG/2ML IJ SOLN
INTRAMUSCULAR | Status: AC
  Filled 2024-03-28: qty 2

## 2024-03-28 MED ORDER — SODIUM CHLORIDE 0.9 % IV SOLN: 250.0000 mL | INTRAVENOUS | Status: DC | PRN

## 2024-03-28 MED ORDER — ASPIRIN 81 MG PO CHEW
81.0000 mg | CHEWABLE_TABLET | ORAL | Status: AC
  Administered 2024-03-28: 81 mg via ORAL

## 2024-03-28 MED ORDER — LIDOCAINE HCL 1 % IJ SOLN
INTRAMUSCULAR | Status: AC
  Filled 2024-03-28: qty 20

## 2024-03-28 MED ORDER — MIDAZOLAM HCL (PF) 2 MG/2ML IJ SOLN
INTRAMUSCULAR | Status: DC | PRN
  Administered 2024-03-28: 1 mg via INTRAVENOUS

## 2024-03-28 MED ORDER — VERAPAMIL HCL 2.5 MG/ML IV SOLN
INTRAVENOUS | Status: AC
  Filled 2024-03-28: qty 2

## 2024-03-28 MED ORDER — HYDRALAZINE HCL 20 MG/ML IJ SOLN: 10.0000 mg | INTRAMUSCULAR | Status: DC | PRN

## 2024-03-28 MED ORDER — FENTANYL CITRATE (PF) 100 MCG/2ML IJ SOLN
INTRAMUSCULAR | Status: DC | PRN
  Administered 2024-03-28: 50 ug via INTRAVENOUS

## 2024-03-28 MED ORDER — FENTANYL CITRATE (PF) 100 MCG/2ML IJ SOLN
INTRAMUSCULAR | Status: AC
  Filled 2024-03-28: qty 2

## 2024-03-28 MED ORDER — SODIUM CHLORIDE 0.9 % IV SOLN
250.0000 mL | INTRAVENOUS | Status: DC | PRN
  Administered 2024-03-28: 250 mL via INTRAVENOUS

## 2024-03-28 MED ORDER — ACETAMINOPHEN 325 MG PO TABS: 650.0000 mg | ORAL_TABLET | ORAL | Status: DC | PRN

## 2024-03-28 MED ORDER — ATORVASTATIN CALCIUM 40 MG PO TABS: 40.0000 mg | ORAL_TABLET | Freq: Every day | ORAL | 0 refills | Status: AC

## 2024-03-28 MED ORDER — HEPARIN (PORCINE) IN NACL 1000-0.9 UT/500ML-% IV SOLN
INTRAVENOUS | Status: DC | PRN
  Administered 2024-03-28 (×2): 500 mL

## 2024-03-28 MED ORDER — LIDOCAINE HCL (PF) 1 % IJ SOLN
INTRAMUSCULAR | Status: DC | PRN
  Administered 2024-03-28: 2 mL

## 2024-03-28 MED ORDER — ASPIRIN 81 MG PO CHEW
CHEWABLE_TABLET | ORAL | Status: AC
  Filled 2024-03-28: qty 1

## 2024-03-28 MED ORDER — FREE WATER
500.0000 mL | Freq: Once | Status: AC
  Administered 2024-03-28: 500 mL via ORAL

## 2024-03-28 MED ORDER — ONDANSETRON HCL 4 MG/2ML IJ SOLN: 4.0000 mg | Freq: Four times a day (QID) | INTRAMUSCULAR | Status: DC | PRN

## 2024-03-28 MED ORDER — HEPARIN (PORCINE) IN NACL 1000-0.9 UT/500ML-% IV SOLN
INTRAVENOUS | Status: AC
  Filled 2024-03-28: qty 1000

## 2024-03-28 NOTE — Discharge Instructions (Signed)
 Radial Site Care Refer to this sheet in the next few weeks. These instructions provide you with information about caring for yourself after your procedure. Your health care provider may also give you more specific instructions. Your treatment has been planned according to current medical practices, but problems sometimes occur. Call your health care provider if you have any problems or questions after your procedure. What can I expect after the procedure? After your procedure, it is typical to have the following: Bruising at the radial site that usually fades within 1-2 weeks. Blood collecting in the tissue (hematoma) that may be painful to the touch. It should usually decrease in size and tenderness within 1-2 weeks.  Follow these instructions at home: Take medicines only as directed by your health care provider. If you are on a medication called Metformin please do not take for 48 hours after your procedure. Over the next 48hrs please increase your fluid intake of water and non caffeine beverages to flush the contrast dye out of your system.  You may shower 24 hours after the procedure  Leave your bandage on and gently wash the site with plain soap and water. Pat the area dry with a clean towel. Do not rub the site, because this may cause bleeding.  Remove your dressing 48hrs after your procedure and leave open to air.  Do not submerge your site in water for 7 days. This includes swimming and washing dishes.  Check your insertion site every day for redness, swelling, or drainage. Do not apply powder or lotion to the site. Do not flex or bend the affected arm for 24 hours or as directed by your health care provider. Do not push or pull heavy objects with the affected arm for 24 hours or as directed by your health care provider. Do not lift over 10 lb (4.5 kg) for 5 days after your procedure or as directed by your health care provider. Ask your health care provider when it is okay to: Return to  work or school. Resume usual physical activities or sports. Resume sexual activity. Do not drive home if you are discharged the same day as the procedure. Have someone else drive you. You may drive 48 hours after the procedure Do not operate machinery or power tools for 24 hours after the procedure. If your procedure was done as an outpatient procedure, which means that you went home the same day as your procedure, a responsible adult should be with you for the first 24 hours after you arrive home. Keep all follow-up visits as directed by your health care provider. This is important. Contact a health care provider if: You have a fever. You have chills. You have increased bleeding from the radial site. Hold pressure on the site. Get help right away if: You have unusual pain at the radial site. You have redness, warmth, or swelling at the radial site. You have drainage (other than a small amount of blood on the dressing) from the radial site. The radial site is bleeding, and the bleeding does not stop after 15 minutes of holding steady pressure on the site. Your arm or hand becomes pale, cool, tingly, or numb. This information is not intended to replace advice given to you by your health care provider. Make sure you discuss any questions you have with your health care provider. Document Released: 03/13/2010 Document Revised: 07/17/2015 Document Reviewed: 08/27/2013 Elsevier Interactive Patient Education  2018 [employer].

## 2024-05-24 ENCOUNTER — Inpatient Hospital Stay

## 2024-05-24 ENCOUNTER — Inpatient Hospital Stay: Admitting: Oncology
